# Patient Record
Sex: Male | Born: 1990 | Race: Black or African American | Hispanic: No | Marital: Single | Smoking: Never smoker
Health system: Southern US, Community
[De-identification: ages and names within clinical notes are randomized; demographics above are authoritative.]

## PROBLEM LIST (undated history)

## (undated) DIAGNOSIS — Z789 Other specified health status: Secondary | ICD-10-CM

## (undated) HISTORY — PX: NO PAST SURGERIES: SHX2092

---

## 2014-04-29 ENCOUNTER — Encounter (HOSPITAL_COMMUNITY): Payer: Self-pay | Admitting: Emergency Medicine

## 2014-04-29 ENCOUNTER — Inpatient Hospital Stay (HOSPITAL_COMMUNITY)
Admission: EM | Admit: 2014-04-29 | Discharge: 2014-05-02 | DRG: 917 | Disposition: A | Discharge status: Home / Self Care | Payer: Self-pay | Attending: Internal Medicine | Admitting: Internal Medicine

## 2014-04-29 DIAGNOSIS — G9340 Encephalopathy, unspecified: Secondary | ICD-10-CM | POA: Diagnosis present

## 2014-04-29 DIAGNOSIS — R509 Fever, unspecified: Secondary | ICD-10-CM

## 2014-04-29 DIAGNOSIS — E876 Hypokalemia: Secondary | ICD-10-CM | POA: Diagnosis present

## 2014-04-29 DIAGNOSIS — I10 Essential (primary) hypertension: Secondary | ICD-10-CM | POA: Diagnosis present

## 2014-04-29 DIAGNOSIS — F329 Major depressive disorder, single episode, unspecified: Secondary | ICD-10-CM | POA: Diagnosis present

## 2014-04-29 DIAGNOSIS — T50902A Poisoning by unspecified drugs, medicaments and biological substances, intentional self-harm, initial encounter: Secondary | ICD-10-CM

## 2014-04-29 DIAGNOSIS — E860 Dehydration: Secondary | ICD-10-CM | POA: Diagnosis present

## 2014-04-29 DIAGNOSIS — T450X2A Poisoning by antiallergic and antiemetic drugs, intentional self-harm, initial encounter: Principal | ICD-10-CM | POA: Diagnosis present

## 2014-04-29 DIAGNOSIS — N179 Acute kidney failure, unspecified: Secondary | ICD-10-CM | POA: Diagnosis present

## 2014-04-29 DIAGNOSIS — Z23 Encounter for immunization: Secondary | ICD-10-CM

## 2014-04-29 DIAGNOSIS — T50901A Poisoning by unspecified drugs, medicaments and biological substances, accidental (unintentional), initial encounter: Secondary | ICD-10-CM | POA: Diagnosis present

## 2014-04-29 DIAGNOSIS — R45851 Suicidal ideations: Secondary | ICD-10-CM | POA: Diagnosis present

## 2014-04-29 DIAGNOSIS — E870 Hyperosmolality and hypernatremia: Secondary | ICD-10-CM | POA: Diagnosis present

## 2014-04-29 HISTORY — DX: Other specified health status: Z78.9

## 2014-04-29 LAB — COMPREHENSIVE METABOLIC PANEL
ALT: 32 U/L (ref 0–53)
ANION GAP: 19 — AB (ref 5–15)
AST: 35 U/L (ref 0–37)
Albumin: 4.7 g/dL (ref 3.5–5.2)
Alkaline Phosphatase: 84 U/L (ref 39–117)
BUN: 10 mg/dL (ref 6–23)
CALCIUM: 9.9 mg/dL (ref 8.4–10.5)
CO2: 23 mEq/L (ref 19–32)
Chloride: 107 mEq/L (ref 96–112)
Creatinine, Ser: 1.24 mg/dL (ref 0.50–1.35)
GFR calc non Af Amer: 81 mL/min — ABNORMAL LOW (ref >90)
GLUCOSE: 137 mg/dL — AB (ref 70–99)
Potassium: 3.4 mEq/L — ABNORMAL LOW (ref 3.7–5.3)
SODIUM: 149 meq/L — AB (ref 137–147)
Total Bilirubin: <0.2 mg/dL — ABNORMAL LOW (ref 0.3–1.2)
Total Protein: 8.2 g/dL (ref 6.0–8.3)

## 2014-04-29 LAB — BASIC METABOLIC PANEL
Anion gap: 14 (ref 5–15)
BUN: 9 mg/dL (ref 6–23)
CHLORIDE: 106 meq/L (ref 96–112)
CO2: 23 mEq/L (ref 19–32)
Calcium: 9.6 mg/dL (ref 8.4–10.5)
Creatinine, Ser: 1.25 mg/dL (ref 0.50–1.35)
GFR calc Af Amer: >90 mL/min (ref >90)
GFR calc non Af Amer: 80 mL/min — ABNORMAL LOW (ref >90)
GLUCOSE: 91 mg/dL (ref 70–99)
Potassium: 3.8 mEq/L (ref 3.7–5.3)
Sodium: 143 mEq/L (ref 137–147)

## 2014-04-29 LAB — CBC
HCT: 42.6 % (ref 39.0–52.0)
HCT: 39.5 % (ref 39.0–52.0)
HEMOGLOBIN: 14.6 g/dL (ref 13.0–17.0)
HEMOGLOBIN: 13.2 g/dL (ref 13.0–17.0)
MCH: 26.3 pg (ref 26.0–34.0)
MCH: 25.6 pg — AB (ref 26.0–34.0)
MCHC: 34.3 g/dL (ref 30.0–36.0)
MCHC: 33.4 g/dL (ref 30.0–36.0)
MCV: 76.8 fL — ABNORMAL LOW (ref 78.0–100.0)
MCV: 76.7 fL — ABNORMAL LOW (ref 78.0–100.0)
Platelets: 257 10*3/uL (ref 150–400)
Platelets: 266 10*3/uL (ref 150–400)
RBC: 5.55 MIL/uL (ref 4.22–5.81)
RBC: 5.15 MIL/uL (ref 4.22–5.81)
RDW: 12.7 % (ref 11.5–15.5)
RDW: 12.8 % (ref 11.5–15.5)
WBC: 10.2 10*3/uL (ref 4.0–10.5)
WBC: 6.1 10*3/uL (ref 4.0–10.5)

## 2014-04-29 LAB — RAPID URINE DRUG SCREEN, HOSP PERFORMED
Amphetamines: NOT DETECTED
BARBITURATES: NOT DETECTED
Benzodiazepines: NOT DETECTED
Cocaine: NOT DETECTED
Opiates: NOT DETECTED
TETRAHYDROCANNABINOL: NOT DETECTED

## 2014-04-29 LAB — CBG MONITORING, ED: Glucose-Capillary: 81 mg/dL (ref 70–99)

## 2014-04-29 LAB — ACETAMINOPHEN LEVEL: Acetaminophen (Tylenol), Serum: <15 ug/mL (ref 10–30)

## 2014-04-29 LAB — MAGNESIUM: Magnesium: 1.9 mg/dL (ref 1.5–2.5)

## 2014-04-29 LAB — ETHANOL: Alcohol, Ethyl (B): <11 mg/dL (ref 0–11)

## 2014-04-29 LAB — SALICYLATE LEVEL: Salicylate Lvl: <2 mg/dL — ABNORMAL LOW (ref 2.8–20.0)

## 2014-04-29 LAB — MRSA PCR SCREENING: MRSA by PCR: NEGATIVE

## 2014-04-29 MED ORDER — LORAZEPAM 2 MG/ML IJ SOLN: 1.0000 mg | INTRAMUSCULAR | Status: DC | PRN

## 2014-04-29 MED ORDER — POTASSIUM CHLORIDE 2 MEQ/ML IV SOLN
INTRAVENOUS | Status: AC
  Administered 2014-04-29 – 2014-04-30 (×2): via INTRAVENOUS
  Filled 2014-04-29 (×4): qty 1000

## 2014-04-29 MED ORDER — SODIUM CHLORIDE 0.9 % IV BOLUS (SEPSIS)
1000.0000 mL | Freq: Once | INTRAVENOUS | Status: AC
  Administered 2014-04-29: 1000 mL via INTRAVENOUS

## 2014-04-29 MED ORDER — THIAMINE HCL 100 MG/ML IJ SOLN
100.0000 mg | Freq: Every day | INTRAMUSCULAR | Status: DC
  Administered 2014-04-29 – 2014-05-01 (×3): 100 mg via INTRAVENOUS
  Filled 2014-04-29: qty 2
  Filled 2014-04-29: qty 1
  Filled 2014-04-29: qty 2
  Filled 2014-04-29: qty 1

## 2014-04-29 MED ORDER — ONDANSETRON HCL 4 MG PO TABS: 4.0000 mg | ORAL_TABLET | Freq: Four times a day (QID) | ORAL | Status: DC | PRN

## 2014-04-29 MED ORDER — ENOXAPARIN SODIUM 40 MG/0.4ML ~~LOC~~ SOLN
40.0000 mg | SUBCUTANEOUS | Status: DC
  Administered 2014-04-29 – 2014-04-30 (×2): 40 mg via SUBCUTANEOUS
  Filled 2014-04-29 (×3): qty 0.4

## 2014-04-29 MED ORDER — ACETAMINOPHEN 650 MG RE SUPP: 650.0000 mg | Freq: Four times a day (QID) | RECTAL | Status: DC | PRN

## 2014-04-29 MED ORDER — SODIUM CHLORIDE 0.9 % IV BOLUS (SEPSIS): 1000.0000 mL | Freq: Once | INTRAVENOUS | Status: DC

## 2014-04-29 MED ORDER — ACETAMINOPHEN 325 MG PO TABS
650.0000 mg | ORAL_TABLET | Freq: Four times a day (QID) | ORAL | Status: DC | PRN
Start: 2014-04-29 — End: 2014-05-02

## 2014-04-29 MED ORDER — ONDANSETRON HCL 4 MG/2ML IJ SOLN: 4.0000 mg | Freq: Four times a day (QID) | INTRAMUSCULAR | Status: DC | PRN

## 2014-04-29 MED ORDER — SODIUM CHLORIDE 0.9 % IJ SOLN
3.0000 mL | Freq: Two times a day (BID) | INTRAMUSCULAR | Status: DC
  Administered 2014-04-29: 3 mL via INTRAVENOUS
  Administered 2014-04-30: 11:00:00 via INTRAVENOUS
  Administered 2014-05-01: 3 mL via INTRAVENOUS

## 2014-04-29 MED ORDER — VITAMINS A & D EX OINT
TOPICAL_OINTMENT | CUTANEOUS | Status: AC
  Filled 2014-04-29: qty 5

## 2014-04-29 MED ORDER — LORAZEPAM 2 MG/ML IJ SOLN
1.0000 mg | Freq: Once | INTRAMUSCULAR | Status: AC
  Administered 2014-04-29: 1 mg via INTRAVENOUS
  Filled 2014-04-29: qty 1
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
## 2014-04-29 NOTE — ED Notes (Signed)
Bed: RESA Expected date:  Expected time:  Means of arrival: Ambulance (overdose) Comments: overdose

## 2014-04-29 NOTE — H&P (Signed)
[facility] History and Physical  Alejandro Braun WUJ:[id number] DOB: 04/30/1991 DOA: 04/29/2014  Referring physician: ER physician. PCP: No primary care provider on file.   History obtained from ER physician and patient's friend as patient is confused.  Chief Complaint: Drug overdose.  HPI: Alejandro Braun is a 23 y.o. male with no significant past medical history was brought to the ER after patient's friend found that patient was confused and told that he has taken overdose of Benadryl with intention of killing himself. Patient's friend stated that patient initially took Zquil and later Benadryl itself. The quantity was not known but he had consumed almost whole bottle of Benadryl tablets. Other medications were not taken along with it as per the information. In the ER patient was found to be tachycardic and has gradually become more confused. Poison control at this time as requested supportive care with IV fluids and if patient becomes very tachycardic or hypertensive or agitated then to give Ativan as needed and also to watch out for QRS widening. Patient on my exam is confused but follows commands and is oriented to his name only.   Review of Systems: As presented in the history of presenting illness, rest negative.  Past Medical History  Diagnosis Date  . Medical history non-contributory    Past Surgical History  Procedure Laterality Date  . No past surgeries     Social History:  reports that he has never smoked. He does not have any smokeless tobacco history on file. He reports that he drinks alcohol. He reports that he does not use illicit drugs. Where does patient live home. Can patient participate in ADLs? Yes.  No Known Allergies  Family History: History reviewed. No pertinent family history.    Prior to Admission medications   Not on File    Physical Exam: Filed Vitals:   04/29/14 1800 04/29/14 1804 04/29/14 1830 04/29/14 1928  BP: 134/94  124/62 140/92 142/100  Pulse: 116 113 118 116  Temp:      TempSrc:      Resp: 29 22 22 24   SpO2: 98%  98% 99%     General:  Well-developed and nourished.  Eyes: Anicteric no pallor.  ENT: No discharge from the ears eyes nose mouth.  Neck: No mass. No neck rigidity.  Cardiovascular: S1-S2 heard tachycardic.  Respiratory: No rhonchi or crepitations.  Abdomen: Soft nontender bowel sounds present.  Skin: No rash.  Musculoskeletal: No edema.  Psychiatric: Patient is confused.  Neurologic: Patient is confused and is oriented to his name only. Moves all extremities.  Labs on Admission:  Basic Metabolic Panel:  Recent Labs Lab 04/29/14 1607  NA 149*  K 3.4*  CL 107  CO2 23  GLUCOSE 137*  BUN 10  CREATININE 1.24  CALCIUM 9.9   Liver Function Tests:  Recent Labs Lab 04/29/14 1607  AST 35  ALT 32  ALKPHOS 84  BILITOT <0.2*  PROT 8.2  ALBUMIN 4.7   No results for input(s): LIPASE, AMYLASE in the last 168 hours. No results for input(s): AMMONIA in the last 168 hours. CBC:  Recent Labs Lab 04/29/14 1607  WBC 10.2  HGB 14.6  HCT 42.6  MCV 76.8*  PLT 257   Cardiac Enzymes: No results for input(s): CKTOTAL, CKMB, CKMBINDEX, TROPONINI in the last 168 hours.  BNP (last 3 results) No results for input(s): PROBNP in the last 8760 hours. CBG:  Recent Labs Lab 04/29/14 1610  GLUCAP 81    Radiological Exams  on Admission: No results found.  EKG: Independently reviewed. Sinus tachycardia. QRS 82 ms.  Assessment/Plan Active Problems:   Drug overdose   Suicide ideation   Acute encephalopathy   Hypernatremia   1. Acute encephalopathy secondary to intentional drug overdose with Benadryl - patient at this time is being already placed on 2 L normal saline bolus and I have placed patient on D5 W since patient also has hypernatremia. Closely observe and stepdown. If patient gets very agitated or if patient becomes very hypotensive or tachycardic then  patient will need when necessary IV Ativan. If there is QRS widening more than 140 ms then patient will need bolus doses of bicarbonate (not infusion). 2. Hypernatremia - probably from dehydration. Closely follow metabolic panel. 3. Mild hypokalemia - I have placed patient on potassium in the IV fluids. Closely follow metabolic panel. 4. Suicidal ideation - patient is placed on sitter. Consult psychiatry in a.m.    Code Status: Full code.  Family Communication: Patient's friend.  Disposition Plan: Admit to inpatient.    Wanita Derenzo N. [facility] Pager [phone].  If 7PM-7AM, please contact night-coverage [email or web address] Password [email or web address] 04/29/2014, 8:35 PM
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
## 2014-04-29 NOTE — ED Notes (Signed)
Still attempting to get a urine sample, but patient is not fully coherent. Placed posey stretcher alarm under patient to alarm if he gets up.

## 2014-04-29 NOTE — ED Notes (Signed)
[facility] Suggested EKG, Monitor, observe for prolonged QRS and treat with Na Bicarb until narrowed, Benzo's for tachy HR, Observe for seizures, Benzo's to treat. Reg labs plus post 4 hr Tylenol level.

## 2014-04-29 NOTE — ED Notes (Signed)
Pt served IVC papers via [employer].

## 2014-04-29 NOTE — ED Notes (Signed)
Stretcher alarm does not work in recess room

## 2014-04-30 ENCOUNTER — Inpatient Hospital Stay (HOSPITAL_COMMUNITY): Payer: Self-pay

## 2014-04-30 ENCOUNTER — Encounter (HOSPITAL_COMMUNITY): Payer: Self-pay | Admitting: *Deleted

## 2014-04-30 DIAGNOSIS — E876 Hypokalemia: Secondary | ICD-10-CM

## 2014-04-30 DIAGNOSIS — F329 Major depressive disorder, single episode, unspecified: Secondary | ICD-10-CM

## 2014-04-30 DIAGNOSIS — T450X2A Poisoning by antiallergic and antiemetic drugs, intentional self-harm, initial encounter: Principal | ICD-10-CM

## 2014-04-30 LAB — GLUCOSE, CAPILLARY: GLUCOSE-CAPILLARY: 96 mg/dL (ref 70–99)

## 2014-04-30 LAB — BASIC METABOLIC PANEL
Anion gap: 14 (ref 5–15)
Anion gap: 14 (ref 5–15)
BUN: 8 mg/dL (ref 6–23)
BUN: 8 mg/dL (ref 6–23)
CHLORIDE: 105 meq/L (ref 96–112)
CHLORIDE: 102 meq/L (ref 96–112)
CO2: 24 mEq/L (ref 19–32)
CO2: 25 meq/L (ref 19–32)
Calcium: 9.6 mg/dL (ref 8.4–10.5)
Calcium: 9.8 mg/dL (ref 8.4–10.5)
Creatinine, Ser: 1.29 mg/dL (ref 0.50–1.35)
Creatinine, Ser: 1.39 mg/dL — ABNORMAL HIGH (ref 0.50–1.35)
GFR calc Af Amer: 89 mL/min — ABNORMAL LOW (ref >90)
GFR calc non Af Amer: 77 mL/min — ABNORMAL LOW (ref >90)
GFR calc non Af Amer: 70 mL/min — ABNORMAL LOW (ref >90)
GFR, EST AFRICAN AMERICAN: 82 mL/min — AB (ref >90)
GLUCOSE: 102 mg/dL — AB (ref 70–99)
GLUCOSE: 101 mg/dL — AB (ref 70–99)
POTASSIUM: 3.5 meq/L — AB (ref 3.7–5.3)
Potassium: 3.7 mEq/L (ref 3.7–5.3)
Sodium: 143 mEq/L (ref 137–147)
Sodium: 141 mEq/L (ref 137–147)

## 2014-04-30 LAB — CBC WITH DIFFERENTIAL/PLATELET
BASOS ABS: 0 10*3/uL (ref 0.0–0.1)
Basophils Relative: 0 % (ref 0–1)
EOS PCT: 2 % (ref 0–5)
Eosinophils Absolute: 0.1 10*3/uL (ref 0.0–0.7)
HCT: 42.4 % (ref 39.0–52.0)
Hemoglobin: 13.9 g/dL (ref 13.0–17.0)
LYMPHS ABS: 2.5 10*3/uL (ref 0.7–4.0)
Lymphocytes Relative: 36 % (ref 12–46)
MCH: 25.1 pg — ABNORMAL LOW (ref 26.0–34.0)
MCHC: 32.8 g/dL (ref 30.0–36.0)
MCV: 76.7 fL — ABNORMAL LOW (ref 78.0–100.0)
MONO ABS: 0.4 10*3/uL (ref 0.1–1.0)
Monocytes Relative: 6 % (ref 3–12)
Neutro Abs: 3.9 10*3/uL (ref 1.7–7.7)
Neutrophils Relative %: 56 % (ref 43–77)
Platelets: 275 10*3/uL (ref 150–400)
RBC: 5.53 MIL/uL (ref 4.22–5.81)
RDW: 13.1 % (ref 11.5–15.5)
WBC: 7 10*3/uL (ref 4.0–10.5)

## 2014-04-30 LAB — HEPATIC FUNCTION PANEL
ALT: 27 U/L (ref 0–53)
AST: 30 U/L (ref 0–37)
Albumin: 4.4 g/dL (ref 3.5–5.2)
Alkaline Phosphatase: 80 U/L (ref 39–117)
Bilirubin, Direct: <0.2 mg/dL (ref 0.0–0.3)
TOTAL PROTEIN: 7.7 g/dL (ref 6.0–8.3)
Total Bilirubin: 0.3 mg/dL (ref 0.3–1.2)

## 2014-04-30 LAB — URINALYSIS, ROUTINE W REFLEX MICROSCOPIC
Bilirubin Urine: NEGATIVE
Glucose, UA: NEGATIVE mg/dL
HGB URINE DIPSTICK: NEGATIVE
Ketones, ur: NEGATIVE mg/dL
Leukocytes, UA: NEGATIVE
Nitrite: NEGATIVE
PH: 6.5 (ref 5.0–8.0)
Protein, ur: NEGATIVE mg/dL
SPECIFIC GRAVITY, URINE: 1.006 (ref 1.005–1.030)
Urobilinogen, UA: 0.2 mg/dL (ref 0.0–1.0)

## 2014-04-30 LAB — TSH: TSH: 2.51 u[IU]/mL (ref 0.350–4.500)

## 2014-04-30 MED ORDER — INFLUENZA VAC SPLIT QUAD 0.5 ML IM SUSY
0.5000 mL | PREFILLED_SYRINGE | INTRAMUSCULAR | Status: AC
  Administered 2014-05-01: 0.5 mL via INTRAMUSCULAR
  Filled 2014-04-30 (×2): qty 0.5

## 2014-04-30 NOTE — Consult Note (Signed)
[place] [facility] Consult   Reason for Consult:  overdose of Benadryl with intention of killing himself Referring Physician:  Rise Patience, MD Alejandro Braun is an 23 y.o. male. Total Time spent with patient: 45 minutes  Assessment: AXIS I:  Depressive Disorder NOS AXIS II:  Deferred AXIS III:   Past Medical History  Diagnosis Date  . Medical history non-contributory    AXIS IV:  other psychosocial or environmental problems, problems related to social environment and problems with access to health care services AXIS V:  41-50 serious symptoms  Plan: Case will be discussed with Rise Patience, MD and Sindy Messing. LCSW Recommend psychiatric Inpatient admission when medically cleared. Supportive therapy provided about ongoing stressors.  Appreciate psychiatric consultation and follow up as clinically required Please contact [phone] or [phone] if needs further assistance  Subjective:   Alejandro Braun is a 23 y.o. male patient admitted with overdose of Benadryl with intention of killing himself.  HPI:  Alejandro Braun is a 23 y.o. male seen and chart reviewed for psych consulation and evaluation of depression and status post suicidal attempt with overdose on benadryl and Zquil. Patient stated that he wants to end his life because of severe psychosocial and financial stresses. He has an argument with his GF x 2 1/2 years regarding moving out of his mother's home and saving money instead of spending on his mother utility bills etc. His GF saw him briefly after overdose and called EMS. Patient has no past history of mental illness and medical conditions. He works in Doctor, general practice. He does not know his biological father and close to his mother and grand mother. His mother is out of town for work.   Medical History: Patient with no significant past medical history was brought to the ER after patient's friend found that patient  was confused and told that he has taken overdose of Benadryl with intention of killing himself. Patient's friend stated that patient initially took Glass[place] and later Benadryl itself. The quantity was not known but he had consumed almost whole bottle of Benadryl tablets. Other medications were not taken along with it as per the information. In the ER patient was found to be tachycardic and has gradually become more confused. Poison control at this time as requested supportive care with IV fluids and if patient becomes very tachycardic or hypertensive or agitated then to give Ativan as needed and also to watch out for QRS widening. Patient on my exam is confused but follows commands and is oriented to his name only.   Review of Systems: As presented in the history of presenting illness, rest negative.  HPI Elements:   Location:  depression. Quality:  psychosocial stresses. Severity:  intentional overdose. Timing:  financial difficulties and argument with GF. Duration:  two months. Context:  status post suicide attempt.  Past Psychiatric History: Past Medical History  Diagnosis Date  . Medical history non-contributory     reports that he has never smoked. He does not have any smokeless tobacco history on file. He reports that he drinks about 1.2 oz of alcohol per week. He reports that he does not use illicit drugs. Family History  Problem Relation Age of Onset  . Diabetes type I Mother      Living Arrangements: Spouse/significant other   Abuse/Neglect [facility]) Physical Abuse: Denies Verbal Abuse: Denies Sexual Abuse: Denies Allergies:  No Known Allergies  ACT Assessment Complete:  NO Objective: Blood pressure 157/100,  pulse 100, temperature 98.7 F (37.1 C), temperature source Oral, resp. rate 16, height 5\' 10"  (1.778 m), weight 91.8 kg (202 lb 6.1 oz), SpO2 100 %.Body mass index is 29.04 kg/(m^2). Results for orders placed or performed during the hospital encounter of 04/29/14 (from the  past 72 hour(s))  CBC     Status: Abnormal   Collection Time: 04/29/14  4:07 PM  Result Value Ref Range   WBC 10.2 4.0 - 10.5 K/uL   RBC 5.55 4.22 - 5.81 MIL/uL   Hemoglobin 14.6 13.0 - 17.0 g/dL   HCT 05/01/14 79.1 - 05.8 %   MCV 76.8 (L) 78.0 - 100.0 fL   MCH 26.3 26.0 - 34.0 pg   MCHC 34.3 30.0 - 36.0 g/dL   RDW 61.0 04.2 - 90.6 %   Platelets 257 150 - 400 K/uL  Comprehensive metabolic panel     Status: Abnormal   Collection Time: 04/29/14  4:07 PM  Result Value Ref Range   Sodium 149 (H) 137 - 147 mEq/L   Potassium 3.4 (L) 3.7 - 5.3 mEq/L   Chloride 107 96 - 112 mEq/L   CO2 23 19 - 32 mEq/L   Glucose, Bld 137 (H) 70 - 99 mg/dL   BUN 10 6 - 23 mg/dL   Creatinine, Ser 05/01/14 0.50 - 1.35 mg/dL   Calcium 9.9 8.4 - 3.92 mg/dL   Total Protein 8.2 6.0 - 8.3 g/dL   Albumin 4.7 3.5 - 5.2 g/dL   AST 35 0 - 37 U/L   ALT 32 0 - 53 U/L   Alkaline Phosphatase 84 39 - 117 U/L   Total Bilirubin <0.2 (L) 0.3 - 1.2 mg/dL   GFR calc non Af Amer 81 (L) >90 mL/min   GFR calc Af Amer >90 >90 mL/min    Comment: (NOTE) The eGFR has been calculated using the CKD EPI equation. This calculation has not been validated in all clinical situations. eGFR's persistently <90 mL/min signify possible Chronic Kidney Disease.    Anion gap 19 (H) 5 - 15  Ethanol (ETOH)     Status: None   Collection Time: 04/29/14  4:07 PM  Result Value Ref Range   Alcohol, Ethyl (B) <11 0 - 11 mg/dL    Comment:        LOWEST DETECTABLE LIMIT FOR SERUM ALCOHOL IS 11 mg/dL FOR MEDICAL PURPOSES ONLY   Acetaminophen level     Status: None   Collection Time: 04/29/14  4:07 PM  Result Value Ref Range   Acetaminophen (Tylenol), Serum <15.0 10 - 30 ug/mL    Comment:        THERAPEUTIC CONCENTRATIONS VARY SIGNIFICANTLY. A RANGE OF 10-30 ug/mL MAY BE AN EFFECTIVE CONCENTRATION FOR MANY PATIENTS. HOWEVER, SOME ARE BEST TREATED AT CONCENTRATIONS OUTSIDE THIS RANGE. ACETAMINOPHEN CONCENTRATIONS >150 ug/mL AT 4 HOURS  AFTER INGESTION AND >50 ug/mL AT 12 HOURS AFTER INGESTION ARE OFTEN ASSOCIATED WITH TOXIC REACTIONS.   Salicylate level     Status: Abnormal   Collection Time: 04/29/14  4:07 PM  Result Value Ref Range   Salicylate Lvl <2.0 (L) 2.8 - 20.0 mg/dL  CBG monitoring, ED     Status: None   Collection Time: 04/29/14  4:10 PM  Result Value Ref Range   Glucose-Capillary 81 70 - 99 mg/dL   Comment 1 Notify RN   Urine rapid drug screen (hosp performed)     Status: None   Collection Time: 04/29/14  6:33 PM  Result Value Ref  Range   Opiates NONE DETECTED NONE DETECTED   Cocaine NONE DETECTED NONE DETECTED   Benzodiazepines NONE DETECTED NONE DETECTED   Amphetamines NONE DETECTED NONE DETECTED   Tetrahydrocannabinol NONE DETECTED NONE DETECTED   Barbiturates NONE DETECTED NONE DETECTED    Comment:        DRUG SCREEN FOR MEDICAL PURPOSES ONLY.  IF CONFIRMATION IS NEEDED FOR ANY PURPOSE, NOTIFY LAB WITHIN 5 DAYS.        LOWEST DETECTABLE LIMITS FOR URINE DRUG SCREEN Drug Class       Cutoff (ng/mL) Amphetamine      1000 Barbiturate      200 Benzodiazepine   829 Tricyclics       937 Opiates          300 Cocaine          300 THC              50   MRSA PCR Screening     Status: None   Collection Time: 04/29/14  9:05 PM  Result Value Ref Range   MRSA by PCR NEGATIVE NEGATIVE    Comment:        The GeneXpert MRSA Assay (FDA approved for NASAL specimens only), is one component of a comprehensive MRSA colonization surveillance program. It is not intended to diagnose MRSA infection nor to guide or monitor treatment for MRSA infections.   Basic metabolic panel     Status: Abnormal   Collection Time: 04/29/14  9:27 PM  Result Value Ref Range   Sodium 143 137 - 147 mEq/L   Potassium 3.8 3.7 - 5.3 mEq/L   Chloride 106 96 - 112 mEq/L   CO2 23 19 - 32 mEq/L   Glucose, Bld 91 70 - 99 mg/dL   BUN 9 6 - 23 mg/dL   Creatinine, Ser 1.25 0.50 - 1.35 mg/dL   Calcium 9.6 8.4 - 10.5 mg/dL    GFR calc non Af Amer 80 (L) >90 mL/min   GFR calc Af Amer >90 >90 mL/min    Comment: (NOTE) The eGFR has been calculated using the CKD EPI equation. This calculation has not been validated in all clinical situations. eGFR's persistently <90 mL/min signify possible Chronic Kidney Disease.    Anion gap 14 5 - 15  Magnesium     Status: None   Collection Time: 04/29/14  9:27 PM  Result Value Ref Range   Magnesium 1.9 1.5 - 2.5 mg/dL  Acetaminophen level     Status: None   Collection Time: 04/29/14  9:27 PM  Result Value Ref Range   Acetaminophen (Tylenol), Serum <15.0 10 - 30 ug/mL    Comment:        THERAPEUTIC CONCENTRATIONS VARY SIGNIFICANTLY. A RANGE OF 10-30 ug/mL MAY BE AN EFFECTIVE CONCENTRATION FOR MANY PATIENTS. HOWEVER, SOME ARE BEST TREATED AT CONCENTRATIONS OUTSIDE THIS RANGE. ACETAMINOPHEN CONCENTRATIONS >150 ug/mL AT 4 HOURS AFTER INGESTION AND >50 ug/mL AT 12 HOURS AFTER INGESTION ARE OFTEN ASSOCIATED WITH TOXIC REACTIONS.   Salicylate level     Status: Abnormal   Collection Time: 04/29/14  9:27 PM  Result Value Ref Range   Salicylate Lvl <1.6 (L) 2.8 - 20.0 mg/dL  CBC     Status: Abnormal   Collection Time: 04/29/14  9:27 PM  Result Value Ref Range   WBC 6.1 4.0 - 10.5 K/uL   RBC 5.15 4.22 - 5.81 MIL/uL   Hemoglobin 13.2 13.0 - 17.0 g/dL   HCT 39.5 39.0 - 52.0 %  MCV 76.7 (L) 78.0 - 100.0 fL   MCH 25.6 (L) 26.0 - 34.0 pg   MCHC 33.4 30.0 - 36.0 g/dL   RDW 49.8 69.2 - 29.4 %   Platelets 266 150 - 400 K/uL  TSH     Status: None   Collection Time: 04/29/14  9:27 PM  Result Value Ref Range   TSH 2.510 0.350 - 4.500 uIU/mL    Comment: Performed at [facility]  Glucose, capillary     Status: None   Collection Time: 04/29/14  9:56 PM  Result Value Ref Range   Glucose-Capillary 96 70 - 99 mg/dL   Comment 1 Repeat Test   Basic metabolic panel     Status: Abnormal   Collection Time: 04/30/14  1:05 AM  Result Value Ref Range   Sodium 143 137 -  147 mEq/L   Potassium 3.7 3.7 - 5.3 mEq/L   Chloride 105 96 - 112 mEq/L   CO2 24 19 - 32 mEq/L   Glucose, Bld 102 (H) 70 - 99 mg/dL   BUN 8 6 - 23 mg/dL   Creatinine, Ser 0.82 0.50 - 1.35 mg/dL   Calcium 9.6 8.4 - 36.6 mg/dL   GFR calc non Af Amer 77 (L) >90 mL/min   GFR calc Af Amer 89 (L) >90 mL/min    Comment: (NOTE) The eGFR has been calculated using the CKD EPI equation. This calculation has not been validated in all clinical situations. eGFR's persistently <90 mL/min signify possible Chronic Kidney Disease.    Anion gap 14 5 - 15  Urinalysis, Routine w reflex microscopic     Status: None   Collection Time: 04/30/14  3:13 AM  Result Value Ref Range   Color, Urine YELLOW YELLOW   APPearance CLEAR CLEAR   Specific Gravity, Urine 1.006 1.005 - 1.030   pH 6.5 5.0 - 8.0   Glucose, UA NEGATIVE NEGATIVE mg/dL   Hgb urine dipstick NEGATIVE NEGATIVE   Bilirubin Urine NEGATIVE NEGATIVE   Ketones, ur NEGATIVE NEGATIVE mg/dL   Protein, ur NEGATIVE NEGATIVE mg/dL   Urobilinogen, UA 0.2 0.0 - 1.0 mg/dL   Nitrite NEGATIVE NEGATIVE   Leukocytes, UA NEGATIVE NEGATIVE    Comment: MICROSCOPIC NOT DONE ON URINES WITH NEGATIVE PROTEIN, BLOOD, LEUKOCYTES, NITRITE, OR GLUCOSE <1000 mg/dL.  Basic metabolic panel     Status: Abnormal   Collection Time: 04/30/14  5:10 AM  Result Value Ref Range   Sodium 141 137 - 147 mEq/L   Potassium 3.5 (L) 3.7 - 5.3 mEq/L   Chloride 102 96 - 112 mEq/L   CO2 25 19 - 32 mEq/L   Glucose, Bld 101 (H) 70 - 99 mg/dL   BUN 8 6 - 23 mg/dL   Creatinine, Ser 9.80 (H) 0.50 - 1.35 mg/dL   Calcium 9.8 8.4 - 28.7 mg/dL   GFR calc non Af Amer 70 (L) >90 mL/min   GFR calc Af Amer 82 (L) >90 mL/min    Comment: (NOTE) The eGFR has been calculated using the CKD EPI equation. This calculation has not been validated in all clinical situations. eGFR's persistently <90 mL/min signify possible Chronic Kidney Disease.    Anion gap 14 5 - 15  Hepatic function panel      Status: None   Collection Time: 04/30/14  5:10 AM  Result Value Ref Range   Total Protein 7.7 6.0 - 8.3 g/dL   Albumin 4.4 3.5 - 5.2 g/dL   AST 30 0 - 37 U/L  ALT 27 0 - 53 U/L   Alkaline Phosphatase 80 39 - 117 U/L   Total Bilirubin 0.3 0.3 - 1.2 mg/dL   Bilirubin, Direct <0.2 0.0 - 0.3 mg/dL   Indirect Bilirubin NOT CALCULATED 0.3 - 0.9 mg/dL  CBC with Differential     Status: Abnormal   Collection Time: 04/30/14  5:10 AM  Result Value Ref Range   WBC 7.0 4.0 - 10.5 K/uL   RBC 5.53 4.22 - 5.81 MIL/uL   Hemoglobin 13.9 13.0 - 17.0 g/dL   HCT 42.4 39.0 - 52.0 %   MCV 76.7 (L) 78.0 - 100.0 fL   MCH 25.1 (L) 26.0 - 34.0 pg   MCHC 32.8 30.0 - 36.0 g/dL   RDW 13.1 11.5 - 15.5 %   Platelets 275 150 - 400 K/uL   Neutrophils Relative % 56 43 - 77 %   Neutro Abs 3.9 1.7 - 7.7 K/uL   Lymphocytes Relative 36 12 - 46 %   Lymphs Abs 2.5 0.7 - 4.0 K/uL   Monocytes Relative 6 3 - 12 %   Monocytes Absolute 0.4 0.1 - 1.0 K/uL   Eosinophils Relative 2 0 - 5 %   Eosinophils Absolute 0.1 0.0 - 0.7 K/uL   Basophils Relative 0 0 - 1 %   Basophils Absolute 0.0 0.0 - 0.1 K/uL   Labs are reviewed.  Current Facility-Administered Medications  Medication Dose Route Frequency Provider Last Rate Last Dose  . acetaminophen (TYLENOL) tablet 650 mg  650 mg Oral Q6H PRN Rise Patience, MD       Or  . acetaminophen (TYLENOL) suppository 650 mg  650 mg Rectal Q6H PRN Rise Patience, MD      . dextrose 5 % 1,000 mL with potassium chloride 20 mEq infusion   Intravenous Continuous Rise Patience, MD 125 mL/hr at 04/29/14 2143    . enoxaparin (LOVENOX) injection 40 mg  40 mg Subcutaneous Q24H Rise Patience, MD   40 mg at 04/29/14 2151  . [START ON 05/01/2014] Influenza vac split quadrivalent PF (FLUARIX) injection 0.5 mL  0.5 mL Intramuscular Tomorrow-1000 Rise Patience, MD      . LORazepam (ATIVAN) injection 1 mg  1 mg Intravenous Q2H PRN Rise Patience, MD      . ondansetron  [facility]) tablet 4 mg  4 mg Oral Q6H PRN Rise Patience, MD       Or  . ondansetron [facility]) injection 4 mg  4 mg Intravenous Q6H PRN Rise Patience, MD      . sodium chloride 0.9 % bolus 1,000 mL  1,000 mL Intravenous Once Mariea Clonts, MD   Stopped at 04/29/14 1908  . sodium chloride 0.9 % injection 3 mL  3 mL Intravenous Q12H Rise Patience, MD   3 mL at 04/29/14 2200  . thiamine (B-1) injection 100 mg  100 mg Intravenous Daily Rise Patience, MD   100 mg at 04/29/14 2150  . vitamin A & D ointment             Psychiatric Specialty Exam: Physical Exam as per history and physical  ROS depressions, stresses and impulsive overdose  Blood pressure 157/100, pulse 100, temperature 98.7 F (37.1 C), temperature source Oral, resp. rate 16, height $RemoveBe'5\' 10"'[id number]$  (1.778 m), weight 91.8 kg (202 lb 6.1 oz), SpO2 100 %.Body mass index is 29.04 kg/(m^2).  General Appearance: Disheveled and Guarded  Eye Contact::  Fair  Speech:  Clear  and Coherent and Slow  Volume:  Decreased  Mood:  Depressed, Hopeless and Worthless  Affect:  Constricted and Depressed  Thought Process:  Coherent and Goal Directed  Orientation:  Full (Time, Place, and Person)  Thought Content:  Rumination  Suicidal Thoughts:  Yes.  with intent/plan  Homicidal Thoughts:  No  Memory:  Immediate;   Fair Recent;   Fair  Judgement:  Impaired  Insight:  Lacking  Psychomotor Activity:  Decreased  Concentration:  Fair  Recall:  [employer] of Knowledge:Fair  Language: Good  Akathisia:  NA  Handed:  Right  AIMS (if indicated):     Assets:  Communication Skills Desire for Improvement Intimacy Leisure Time Physical Health Resilience Social Support Talents/Skills  Sleep:      Musculoskeletal: Strength & Muscle Tone: decreased Gait & Station: unable to stand Patient leans: N/A  Treatment Plan Summary: Daily contact with patient to assess and evaluate symptoms and progress in treatment Medication management   Recommend no psychotropic medication at this time and meets criteria for acute psych admission and needs crisis stabilization.   Alejandro Braun,Alejandro R. 04/30/2014 9:43 AM

## 2014-04-30 NOTE — Progress Notes (Signed)
Clinical Social Work Department CLINICAL SOCIAL WORK PSYCHIATRY SERVICE LINE ASSESSMENT 04/30/2014  Patient:  Alejandro Braun [facility]  Account:  [id number]  Admit Date:  04/29/2014  Clinical Social Worker:  Sindy Messing, LCSW  Date/Time:  04/30/2014 11:30 AM Referred by:  Physician  Date referred:  04/30/2014 Reason for Referral  Psychosocial assessment   Presenting Symptoms/Problems (In the person's/family's own words):   Psych consulted due to overdose.   Abuse/Neglect/Trauma History (check all that apply)  Denies history   Abuse/Neglect/Trauma Comments:   Psychiatric History (check all that apply)  Denies history   Psychiatric medications:  None   Current Mental Health Hospitalizations/Previous Mental Health History:   Patient denies any previous MH diagnosis or concerns.   Current provider:   None   Place and Date:   N/A   Current Medications:   Scheduled Meds:      . enoxaparin (LOVENOX) injection  40 mg Subcutaneous Q24H  . [START ON 05/01/2014] Influenza vac split quadrivalent PF  0.5 mL Intramuscular Tomorrow-1000  . sodium chloride  1,000 mL Intravenous Once  . sodium chloride  3 mL Intravenous Q12H  . thiamine IV  100 mg Intravenous Daily  . vitamin A & D            Continuous Infusions:      . dextrose 5 % 1,000 mL with potassium chloride 20 mEq infusion 125 mL/hr at 04/29/14 2143          PRN Meds:.acetaminophen **OR** acetaminophen, LORazepam, ondansetron **OR** ondansetron (ZOFRAN) IV       Previous Impatient Admission/Date/Reason:   Patient denies any previous hospitalizations.   Emotional Health / Current Symptoms    Suicide/Self Harm  Suicide attempt in past (date/description)   Suicide attempt in the past:   Patient reports he got upset with girlfriend and after the argued he took Benadryl tablets and drank a bottle of Zquill. Patient reports this was an impulsive decision and has never had SI in the past and has never attempted to harm  himself before. Patient denies any current SI or HI.   Other harmful behavior:   None reported   Psychotic/Dissociative Symptoms  None reported   Other Psychotic/Dissociative Symptoms:   N/A    Attention/Behavioral Symptoms  Within Normal Limits   Other Attention / Behavioral Symptoms:   Patient engaged in assessment.    Cognitive Impairment  Within Normal Limits   Other Cognitive Impairment:   Patient alert and oriented.    Mood and Adjustment  Mood Congruent    Stress, Anxiety, Trauma, Any Recent Loss/Stressor  Relationship   Anxiety (frequency):   N/A   Phobia (specify):   N/A   Compulsive behavior (specify):   N/A   Obsessive behavior (specify):   N/A   Other:   Patient reports he and girlfriend have been arguing more often due to upcoming move.   Substance Abuse/Use  Current substance use   SBIRT completed (please refer for detailed history):  Y  Self-reported substance use:   Patient reports he drinks about 1 day out of the week. Patient reports he drinks 2-3 beers at social gatherings. Patient denies any further substance use.   Urinary Drug Screen Completed:  Y Alcohol level:   <11    Environmental/Housing/Living Arrangement  Stable housing   Who is in the home:   Mom, grandmother, and girlfriend   Emergency contact:  [place]   Patient's Strengths and Goals (patient's own words):   Patient  has supportive family and is currently employed.   Clinical Social Worker's Interpretive Summary:   CSW received referral in order to complete psychosocial assessment. CSW reviewed chart and met with patient and girlfriend at bedside. CSW introduced myself and explained role and patient agreeable to girlfriend involvement during assessment.    Patient reports he and girlfriend have been together for about 2.5 years. Patient and girlfriend met in school and are currently living together with his mother and grandmother. Patient  and girlfriend have been saving up money and plan to move into their own apartment soon. Patient and girlfriend spoke about stressors in relationship such as upcoming move and girlfriend's parents not approving of their relationship. Patient currently works at [employer] and reports his mother and grandmother are very supportive but the rest of his family lives in [place].    Patient reports that he and girlfriend have a good relationship but started arguing yesterday. Patient reports that they have been stressed by family not supporting them and recent move. Patient denies any previous MH concerns and reports he is generally a happy person. Patient reports he had not planned to harm himself but overdosed impulsively. Patient reports he is thinking more clear now and would never want to harm himself again. Patient reports that he and girlfriend have talked about relationship problems and want to work on their communication. CSW encouraged patient and girlfriend to consider couples counseling to strengthen their relationship and develop good communication skills.    CSW explained that patient is under IVC at this time and psych MD will make recommendations for DC plans. Patient understanding of plans and receptive to assessment. CSW will continue to follow.   Disposition:  Recommend Psych CSW continuing to support while in hospital   [place], [place] [phone]

## 2014-04-30 NOTE — Progress Notes (Signed)
[facility] PROGRESS NOTE  Alejandro Braun [name] DOB: 01/29/1991 DOA: 04/29/2014 PCP: No primary care provider on file.  Assessment/Plan: Intentional drug overdose with Benadryl with associated acute encephalopathy Patient alert and oriented this morning and denies any symptoms. He reports having an argument with his girlfriend and took several tablets of benadryl and had also drank 7 shorts of vodka. Patient hypertensive but clinically stable. Maximum Temp of 100.4 F overnight. No signs of infection Continue IV hydration. Monitor QTC -She denies further suicidal ideation. History of depression or previous suicidal ideations or attempts. Psychiatry consult placed. Patient counseled. Continue sitter until cleared.  Hypokalemia Continue supplement in IV fluids  Acute kidney injury Mild. Monitor with IV hydration.   DVT prophylaxis: Subcutaneous Lovenox Diet: Regular  Code Status: Full code Family Communication: Mother and girlfriend at bedside  Disposition Plan: tx to telemetry. Home once clinically stable and cleared by psychiatry   Consultants:  Psychiatry  Procedures:  None  Antibiotics:  None  HPI/Subjective: Since seen and examined this morning. Denies any headache, dizziness, blurred vision, nausea, vomiting, breath. Denies any bowel or urinary symptoms. Admission H&P reviewed.  Objective: Filed Vitals:   04/30/14 1000  BP: 138/99  Pulse: 93  Temp:   Resp: 23    Intake/Output Summary (Last 24 hours) at 04/30/14 1255 Last data filed at 04/30/14 1010  Gross per 24 hour  Intake 1954.42 ml  Output   2375 ml  Net -420.58 ml   Filed Weights   04/29/14 2300  Weight: 91.8 kg (202 lb 6.1 oz)    Exam:   General:  Young male in no acute distress  HEENT: No pallor, moist oral mucosa, normal pupils  Chest: Clear to auscultation bilaterally  CVS: Normal S1 and S2, no murmurs  Abdomen: Soft, nondistended, nontender  Images:  Warm, no edema  CNS: Alert and oriented     Data Reviewed: Basic Metabolic Panel:  Recent Labs Lab 04/29/14 1607 04/29/14 2127 04/30/14 0105 04/30/14 0510  NA 149* 143 143 141  K 3.4* 3.8 3.7 3.5*  CL 107 106 105 102  CO2 23 23 24 25   GLUCOSE 137* 91 102* 101*  BUN 10 9 8 8   CREATININE 1.24 1.25 1.29 1.39*  CALCIUM 9.9 9.6 9.6 9.8  MG  --  1.9  --   --    Liver Function Tests:  Recent Labs Lab 04/29/14 1607 04/30/14 0510  AST 35 30  ALT 32 27  ALKPHOS 84 80  BILITOT <0.2* 0.3  PROT 8.2 7.7  ALBUMIN 4.7 4.4   No results for input(s): LIPASE, AMYLASE in the last 168 hours. No results for input(s): AMMONIA in the last 168 hours. CBC:  Recent Labs Lab 04/29/14 1607 04/29/14 2127 04/30/14 0510  WBC 10.2 6.1 7.0  NEUTROABS  --   --  3.9  HGB 14.6 13.2 13.9  HCT 42.6 39.5 42.4  MCV 76.8* 76.7* 76.7*  PLT 257 266 275   Cardiac Enzymes: No results for input(s): CKTOTAL, CKMB, CKMBINDEX, TROPONINI in the last 168 hours. BNP (last 3 results) No results for input(s): PROBNP in the last 8760 hours. CBG:  Recent Labs Lab 04/29/14 1610 04/29/14 2156  GLUCAP 81 96    Recent Results (from the past 240 hour(s))  MRSA PCR Screening     Status: None   Collection Time: 04/29/14  9:05 PM  Result Value Ref Range Status   MRSA by PCR NEGATIVE NEGATIVE Final    Comment:  The GeneXpert MRSA Assay (FDA approved for NASAL specimens only), is one component of a comprehensive MRSA colonization surveillance program. It is not intended to diagnose MRSA infection nor to guide or monitor treatment for MRSA infections.      Studies: Dg Chest Port 1 View  04/30/2014   CLINICAL DATA:  Initial evaluation for acute fever.  EXAM: PORTABLE CHEST - 1 VIEW  COMPARISON:  None.  FINDINGS: The cardiac and mediastinal silhouettes are within normal limits.  The lungs are normally inflated. No airspace consolidation, pleural effusion, or pulmonary edema is identified.  There is no pneumothorax.  No acute osseous abnormality identified.  IMPRESSION: No active cardiopulmonary disease.   Electronically Signed   By: Rise MuBenjamin  McClintock M.D.   On: 04/30/2014 03:28    Scheduled Meds: . enoxaparin (LOVENOX) injection  40 mg Subcutaneous Q24H  . [START ON 05/01/2014] Influenza vac split quadrivalent PF  0.5 mL Intramuscular Tomorrow-1000  . sodium chloride  1,000 mL Intravenous Once  . sodium chloride  3 mL Intravenous Q12H  . thiamine IV  100 mg Intravenous Daily   Continuous Infusions: . dextrose 5 % 1,000 mL with potassium chloride 20 mEq infusion 125 mL/hr at 04/29/14 2143      Time spent: 25 minutes    [name], [name]  [facility] Pager [phone] If 7PM-7AM, please contact night-coverage at [email or web address], password [email or web address] 04/30/2014, 12:55 PM  LOS: 1 day

## 2014-05-01 DIAGNOSIS — I4581 Long QT syndrome: Secondary | ICD-10-CM

## 2014-05-01 DIAGNOSIS — T50902D Poisoning by unspecified drugs, medicaments and biological substances, intentional self-harm, subsequent encounter: Secondary | ICD-10-CM

## 2014-05-01 DIAGNOSIS — N179 Acute kidney failure, unspecified: Secondary | ICD-10-CM | POA: Diagnosis not present

## 2014-05-01 LAB — BASIC METABOLIC PANEL
Anion gap: 13 (ref 5–15)
BUN: 11 mg/dL (ref 6–23)
CO2: 27 meq/L (ref 19–32)
Calcium: 10 mg/dL (ref 8.4–10.5)
Chloride: 103 mEq/L (ref 96–112)
Creatinine, Ser: 1.73 mg/dL — ABNORMAL HIGH (ref 0.50–1.35)
GFR calc Af Amer: 63 mL/min — ABNORMAL LOW (ref >90)
GFR calc non Af Amer: 54 mL/min — ABNORMAL LOW (ref >90)
GLUCOSE: 102 mg/dL — AB (ref 70–99)
Potassium: 4.8 mEq/L (ref 3.7–5.3)
SODIUM: 143 meq/L (ref 137–147)

## 2014-05-01 LAB — MAGNESIUM: MAGNESIUM: 2.3 mg/dL (ref 1.5–2.5)

## 2014-05-01 MED ORDER — SODIUM CHLORIDE 0.9 % IV SOLN
INTRAVENOUS | Status: DC
  Administered 2014-05-01 – 2014-05-02 (×3): via INTRAVENOUS

## 2014-05-01 NOTE — Progress Notes (Signed)
Clinical Social Work Progress Note PSYCHIATRY SERVICE LINE 05/01/2014  Patient:  Alejandro Braun [facility]  Account:  [id number]  [place] Date:  04/29/2014  Clinical Social Worker:  Sindy Messing, LCSW  Date/Time:  05/01/2014 11:15 AM  Review of Patient  Overall Medical Condition:   Patient was discussed during progression meeting and MD reports patient is not medically stable to DC today but possibly stable tomorrow.   Participation Level:  Active  Participation Quality  Appropriate   Other Participation Quality:   Patient engaged in session and agreeable for girlfriend to be involved.   Affect  Appropriate   Cognitive  Appropriate   Reaction to Medications/Concerns:   None reported   Modes of Intervention  Support   Summary of Progress/Plan at Discharge   CSW reviewed chart which stated that psych MD is recommending inpatient psych placement. Patient currently under IVC and RN aware of DC plans.    CSW met with patient and girlfriend at bedside. Patient reports that he is tired because he did not get much rest last night but is feeling better emotionally and physically. Patient reports that he has regained his appetite and just finished breakfast. CSW explained no discharge today and explained inpatient recommendation. Patient reports he wishes he could follow up on outpatient basis but understanding of inpatient recommendation. Patient denies any active SI or HI and reports he and girlfriend have been talking about ways to improve their relationship.    CSW will continue to follow and will assist with placement once medically stable.     [place], [place] [phone]

## 2014-05-01 NOTE — Consult Note (Signed)
Psychiatry Consult follow up note  Reason for Consult:  overdose of Benadryl with intention of killing himself Referring Physician:  Rise Patience, MD  Alejandro Braun is an 23 y.o. male. Total Time spent with patient: 45 minutes  Assessment: AXIS I:  Depressive Disorder NOS AXIS II:  Deferred AXIS III:   Past Medical History  Diagnosis Date  . Medical history non-contributory    AXIS IV:  other psychosocial or environmental problems, problems related to social environment and problems with access to health care services AXIS V:  41-50 serious symptoms  Plan: Case will be discussed with Sindy Messing. LCSW Supportive therapy provided about ongoing stressors. Refer to IOP.  Appreciate psychiatric consultation and follow up as clinically required Please contact [phone] or [phone] if needs further assistance  Subjective:   Alejandro Braun is a 23 y.o. male patient admitted with overdose of Benadryl with intention of killing himself.  HPI:  Alejandro Braun is a 23 y.o. male seen and chart reviewed for psych consulation and evaluation of depression and status post suicidal attempt with overdose on benadryl and Zquil. Patient stated that he wants to end his life because of severe psychosocial and financial stresses. He has an argument with his GF x 2 1/2 years regarding moving out of his mother's home and saving money instead of spending on his mother utility bills etc. His GF saw him briefly after overdose and called EMS. Patient has no past history of mental illness and medical conditions. He works in Doctor, general practice. He does not know his biological father and close to his mother and grand mother. His mother is out of town for work.   Interval history: Patient has been feeling better and requesting to be refer to out patient care as he has to go back to work and mean while his fiance and brother can be closely watching for few days or as long  as he needs. He has contracted for safety at this time. He denied current symptoms of depression, anxiety and suicide or homicide ideation, intention or plans. His fiance at bed side seems to be supportive and request the same.   Medical History: Patient with no significant past medical history was brought to the ER after patient's friend found that patient was confused and told that he has taken overdose of Benadryl with intention of killing himself. Patient's friend stated that patient initially took [place] and later Benadryl itself. The quantity was not known but he had consumed almost whole bottle of Benadryl tablets. Other medications were not taken along with it as per the information. In the ER patient was found to be tachycardic and has gradually become more confused. Poison control at this time as requested supportive care with IV fluids and if patient becomes very tachycardic or hypertensive or agitated then to give Ativan as needed and also to watch out for QRS widening. Patient on my exam is confused but follows commands and is oriented to his name only.   Past Psychiatric History: Past Medical History  Diagnosis Date  . Medical history non-contributory     reports that he has never smoked. He does not have any smokeless tobacco history on file. He reports that he drinks about 1.2 oz of alcohol per week. He reports that he does not use illicit drugs. Family History  Problem Relation Age of Onset  . Diabetes type I Mother      Living Arrangements: Spouse/significant other  Abuse/Neglect [facility]) Physical Abuse: Denies Verbal Abuse: Denies Sexual Abuse: Denies Allergies:  No Known Allergies  ACT Assessment Complete:  NO Objective: Blood pressure 134/71, pulse 87, temperature 98.1 F (36.7 C), temperature source Oral, resp. rate 18, height $RemoveBe'5\' 10"'[id number]$  (1.778 m), weight 91.8 kg (202 lb 6.1 oz), SpO2 99 %.Body mass index is 29.04 kg/(m^2). Results for orders placed or performed during the  hospital encounter of 04/29/14 (from the past 72 hour(s))  CBC     Status: Abnormal   Collection Time: 04/29/14  4:07 PM  Result Value Ref Range   WBC 10.2 4.0 - 10.5 K/uL   RBC 5.55 4.22 - 5.81 MIL/uL   Hemoglobin 14.6 13.0 - 17.0 g/dL   HCT 42.6 39.0 - 52.0 %   MCV 76.8 (L) 78.0 - 100.0 fL   MCH 26.3 26.0 - 34.0 pg   MCHC 34.3 30.0 - 36.0 g/dL   RDW 12.7 11.5 - 15.5 %   Platelets 257 150 - 400 K/uL  Comprehensive metabolic panel     Status: Abnormal   Collection Time: 04/29/14  4:07 PM  Result Value Ref Range   Sodium 149 (H) 137 - 147 mEq/L   Potassium 3.4 (L) 3.7 - 5.3 mEq/L   Chloride 107 96 - 112 mEq/L   CO2 23 19 - 32 mEq/L   Glucose, Bld 137 (H) 70 - 99 mg/dL   BUN 10 6 - 23 mg/dL   Creatinine, Ser 1.24 0.50 - 1.35 mg/dL   Calcium 9.9 8.4 - 10.5 mg/dL   Total Protein 8.2 6.0 - 8.3 g/dL   Albumin 4.7 3.5 - 5.2 g/dL   AST 35 0 - 37 U/L   ALT 32 0 - 53 U/L   Alkaline Phosphatase 84 39 - 117 U/L   Total Bilirubin <0.2 (L) 0.3 - 1.2 mg/dL   GFR calc non Af Amer 81 (L) >90 mL/min   GFR calc Af Amer >90 >90 mL/min    Comment: (NOTE) The eGFR has been calculated using the CKD EPI equation. This calculation has not been validated in all clinical situations. eGFR's persistently <90 mL/min signify possible Chronic Kidney Disease.    Anion gap 19 (H) 5 - 15  Ethanol (ETOH)     Status: None   Collection Time: 04/29/14  4:07 PM  Result Value Ref Range   Alcohol, Ethyl (B) <11 0 - 11 mg/dL    Comment:        LOWEST DETECTABLE LIMIT FOR SERUM ALCOHOL IS 11 mg/dL FOR MEDICAL PURPOSES ONLY   Acetaminophen level     Status: None   Collection Time: 04/29/14  4:07 PM  Result Value Ref Range   Acetaminophen (Tylenol), Serum <15.0 10 - 30 ug/mL    Comment:        THERAPEUTIC CONCENTRATIONS VARY SIGNIFICANTLY. A RANGE OF 10-30 ug/mL MAY BE AN EFFECTIVE CONCENTRATION FOR MANY PATIENTS. HOWEVER, SOME ARE BEST TREATED AT CONCENTRATIONS OUTSIDE THIS RANGE. ACETAMINOPHEN  CONCENTRATIONS >150 ug/mL AT 4 HOURS AFTER INGESTION AND >50 ug/mL AT 12 HOURS AFTER INGESTION ARE OFTEN ASSOCIATED WITH TOXIC REACTIONS.   Salicylate level     Status: Abnormal   Collection Time: 04/29/14  4:07 PM  Result Value Ref Range   Salicylate Lvl <0.9 (L) 2.8 - 20.0 mg/dL  CBG monitoring, ED     Status: None   Collection Time: 04/29/14  4:10 PM  Result Value Ref Range   Glucose-Capillary 81 70 - 99 mg/dL   Comment 1 Notify RN  Urine rapid drug screen (hosp performed)     Status: None   Collection Time: 04/29/14  6:33 PM  Result Value Ref Range   Opiates NONE DETECTED NONE DETECTED   Cocaine NONE DETECTED NONE DETECTED   Benzodiazepines NONE DETECTED NONE DETECTED   Amphetamines NONE DETECTED NONE DETECTED   Tetrahydrocannabinol NONE DETECTED NONE DETECTED   Barbiturates NONE DETECTED NONE DETECTED    Comment:        DRUG SCREEN FOR MEDICAL PURPOSES ONLY.  IF CONFIRMATION IS NEEDED FOR ANY PURPOSE, NOTIFY LAB WITHIN 5 DAYS.        LOWEST DETECTABLE LIMITS FOR URINE DRUG SCREEN Drug Class       Cutoff (ng/mL) Amphetamine      1000 Barbiturate      200 Benzodiazepine   767 Tricyclics       209 Opiates          300 Cocaine          300 THC              50   MRSA PCR Screening     Status: None   Collection Time: 04/29/14  9:05 PM  Result Value Ref Range   MRSA by PCR NEGATIVE NEGATIVE    Comment:        The GeneXpert MRSA Assay (FDA approved for NASAL specimens only), is one component of a comprehensive MRSA colonization surveillance program. It is not intended to diagnose MRSA infection nor to guide or monitor treatment for MRSA infections.   Basic metabolic panel     Status: Abnormal   Collection Time: 04/29/14  9:27 PM  Result Value Ref Range   Sodium 143 137 - 147 mEq/L   Potassium 3.8 3.7 - 5.3 mEq/L   Chloride 106 96 - 112 mEq/L   CO2 23 19 - 32 mEq/L   Glucose, Bld 91 70 - 99 mg/dL   BUN 9 6 - 23 mg/dL   Creatinine, Ser 1.25 0.50 - 1.35  mg/dL   Calcium 9.6 8.4 - 10.5 mg/dL   GFR calc non Af Amer 80 (L) >90 mL/min   GFR calc Af Amer >90 >90 mL/min    Comment: (NOTE) The eGFR has been calculated using the CKD EPI equation. This calculation has not been validated in all clinical situations. eGFR's persistently <90 mL/min signify possible Chronic Kidney Disease.    Anion gap 14 5 - 15  Magnesium     Status: None   Collection Time: 04/29/14  9:27 PM  Result Value Ref Range   Magnesium 1.9 1.5 - 2.5 mg/dL  Acetaminophen level     Status: None   Collection Time: 04/29/14  9:27 PM  Result Value Ref Range   Acetaminophen (Tylenol), Serum <15.0 10 - 30 ug/mL    Comment:        THERAPEUTIC CONCENTRATIONS VARY SIGNIFICANTLY. A RANGE OF 10-30 ug/mL MAY BE AN EFFECTIVE CONCENTRATION FOR MANY PATIENTS. HOWEVER, SOME ARE BEST TREATED AT CONCENTRATIONS OUTSIDE THIS RANGE. ACETAMINOPHEN CONCENTRATIONS >150 ug/mL AT 4 HOURS AFTER INGESTION AND >50 ug/mL AT 12 HOURS AFTER INGESTION ARE OFTEN ASSOCIATED WITH TOXIC REACTIONS.   Salicylate level     Status: Abnormal   Collection Time: 04/29/14  9:27 PM  Result Value Ref Range   Salicylate Lvl <4.7 (L) 2.8 - 20.0 mg/dL  CBC     Status: Abnormal   Collection Time: 04/29/14  9:27 PM  Result Value Ref Range   WBC 6.1 4.0 - 10.5  K/uL   RBC 5.15 4.22 - 5.81 MIL/uL   Hemoglobin 13.2 13.0 - 17.0 g/dL   HCT 39.5 39.0 - 52.0 %   MCV 76.7 (L) 78.0 - 100.0 fL   MCH 25.6 (L) 26.0 - 34.0 pg   MCHC 33.4 30.0 - 36.0 g/dL   RDW 12.8 11.5 - 15.5 %   Platelets 266 150 - 400 K/uL  TSH     Status: None   Collection Time: 04/29/14  9:27 PM  Result Value Ref Range   TSH 2.510 0.350 - 4.500 uIU/mL    Comment: Performed at [facility]  Glucose, capillary     Status: None   Collection Time: 04/29/14  9:56 PM  Result Value Ref Range   Glucose-Capillary 96 70 - 99 mg/dL   Comment 1 Repeat Test   Basic metabolic panel     Status: Abnormal   Collection Time: 04/30/14  1:05 AM   Result Value Ref Range   Sodium 143 137 - 147 mEq/L   Potassium 3.7 3.7 - 5.3 mEq/L   Chloride 105 96 - 112 mEq/L   CO2 24 19 - 32 mEq/L   Glucose, Bld 102 (H) 70 - 99 mg/dL   BUN 8 6 - 23 mg/dL   Creatinine, Ser 1.29 0.50 - 1.35 mg/dL   Calcium 9.6 8.4 - 10.5 mg/dL   GFR calc non Af Amer 77 (L) >90 mL/min   GFR calc Af Amer 89 (L) >90 mL/min    Comment: (NOTE) The eGFR has been calculated using the CKD EPI equation. This calculation has not been validated in all clinical situations. eGFR's persistently <90 mL/min signify possible Chronic Kidney Disease.    Anion gap 14 5 - 15  Urinalysis, Routine w reflex microscopic     Status: None   Collection Time: 04/30/14  3:13 AM  Result Value Ref Range   Color, Urine YELLOW YELLOW   APPearance CLEAR CLEAR   Specific Gravity, Urine 1.006 1.005 - 1.030   pH 6.5 5.0 - 8.0   Glucose, UA NEGATIVE NEGATIVE mg/dL   Hgb urine dipstick NEGATIVE NEGATIVE   Bilirubin Urine NEGATIVE NEGATIVE   Ketones, ur NEGATIVE NEGATIVE mg/dL   Protein, ur NEGATIVE NEGATIVE mg/dL   Urobilinogen, UA 0.2 0.0 - 1.0 mg/dL   Nitrite NEGATIVE NEGATIVE   Leukocytes, UA NEGATIVE NEGATIVE    Comment: MICROSCOPIC NOT DONE ON URINES WITH NEGATIVE PROTEIN, BLOOD, LEUKOCYTES, NITRITE, OR GLUCOSE <1000 mg/dL.  Basic metabolic panel     Status: Abnormal   Collection Time: 04/30/14  5:10 AM  Result Value Ref Range   Sodium 141 137 - 147 mEq/L   Potassium 3.5 (L) 3.7 - 5.3 mEq/L   Chloride 102 96 - 112 mEq/L   CO2 25 19 - 32 mEq/L   Glucose, Bld 101 (H) 70 - 99 mg/dL   BUN 8 6 - 23 mg/dL   Creatinine, Ser 1.39 (H) 0.50 - 1.35 mg/dL   Calcium 9.8 8.4 - 10.5 mg/dL   GFR calc non Af Amer 70 (L) >90 mL/min   GFR calc Af Amer 82 (L) >90 mL/min    Comment: (NOTE) The eGFR has been calculated using the CKD EPI equation. This calculation has not been validated in all clinical situations. eGFR's persistently <90 mL/min signify possible Chronic Kidney Disease.    Anion  gap 14 5 - 15  Hepatic function panel     Status: None   Collection Time: 04/30/14  5:10 AM  Result Value  Ref Range   Total Protein 7.7 6.0 - 8.3 g/dL   Albumin 4.4 3.5 - 5.2 g/dL   AST 30 0 - 37 U/L   ALT 27 0 - 53 U/L   Alkaline Phosphatase 80 39 - 117 U/L   Total Bilirubin 0.3 0.3 - 1.2 mg/dL   Bilirubin, Direct <0.2 0.0 - 0.3 mg/dL   Indirect Bilirubin NOT CALCULATED 0.3 - 0.9 mg/dL  CBC with Differential     Status: Abnormal   Collection Time: 04/30/14  5:10 AM  Result Value Ref Range   WBC 7.0 4.0 - 10.5 K/uL   RBC 5.53 4.22 - 5.81 MIL/uL   Hemoglobin 13.9 13.0 - 17.0 g/dL   HCT 42.4 39.0 - 52.0 %   MCV 76.7 (L) 78.0 - 100.0 fL   MCH 25.1 (L) 26.0 - 34.0 pg   MCHC 32.8 30.0 - 36.0 g/dL   RDW 13.1 11.5 - 15.5 %   Platelets 275 150 - 400 K/uL   Neutrophils Relative % 56 43 - 77 %   Neutro Abs 3.9 1.7 - 7.7 K/uL   Lymphocytes Relative 36 12 - 46 %   Lymphs Abs 2.5 0.7 - 4.0 K/uL   Monocytes Relative 6 3 - 12 %   Monocytes Absolute 0.4 0.1 - 1.0 K/uL   Eosinophils Relative 2 0 - 5 %   Eosinophils Absolute 0.1 0.0 - 0.7 K/uL   Basophils Relative 0 0 - 1 %   Basophils Absolute 0.0 0.0 - 0.1 K/uL  Basic metabolic panel     Status: Abnormal   Collection Time: 05/01/14  5:00 AM  Result Value Ref Range   Sodium 143 137 - 147 mEq/L   Potassium 4.8 3.7 - 5.3 mEq/L    Comment: DELTA CHECK NOTED REPEATED TO VERIFY    Chloride 103 96 - 112 mEq/L   CO2 27 19 - 32 mEq/L   Glucose, Bld 102 (H) 70 - 99 mg/dL   BUN 11 6 - 23 mg/dL   Creatinine, Ser 1.73 (H) 0.50 - 1.35 mg/dL   Calcium 10.0 8.4 - 10.5 mg/dL   GFR calc non Af Amer 54 (L) >90 mL/min   GFR calc Af Amer 63 (L) >90 mL/min    Comment: (NOTE) The eGFR has been calculated using the CKD EPI equation. This calculation has not been validated in all clinical situations. eGFR's persistently <90 mL/min signify possible Chronic Kidney Disease.    Anion gap 13 5 - 15  Magnesium     Status: None   Collection Time: 05/01/14   5:00 AM  Result Value Ref Range   Magnesium 2.3 1.5 - 2.5 mg/dL   Labs are reviewed.  Current Facility-Administered Medications  Medication Dose Route Frequency Provider Last Rate Last Dose  . 0.9 %  sodium chloride infusion   Intravenous Continuous Nishant Dhungel, MD 125 mL/hr at 05/01/14 0846    . acetaminophen (TYLENOL) tablet 650 mg  650 mg Oral Q6H PRN Rise Patience, MD       Or  . acetaminophen (TYLENOL) suppository 650 mg  650 mg Rectal Q6H PRN Rise Patience, MD      . ondansetron [facility]) tablet 4 mg  4 mg Oral Q6H PRN Rise Patience, MD       Or  . ondansetron [facility]) injection 4 mg  4 mg Intravenous Q6H PRN Rise Patience, MD      . sodium chloride 0.9 % bolus 1,000 mL  1,000 mL Intravenous  Once Mariea Clonts, MD   Stopped at 04/29/14 1908  . sodium chloride 0.9 % injection 3 mL  3 mL Intravenous Q12H Rise Patience, MD      . thiamine (B-1) injection 100 mg  100 mg Intravenous Daily Rise Patience, MD   100 mg at 05/01/14 1051    Psychiatric Specialty Exam: Physical Exam as per history and physical  ROS depressions, stresses and impulsive overdose  Blood pressure 134/71, pulse 87, temperature 98.1 F (36.7 C), temperature source Oral, resp. rate 18, height _0  (1.778 m), weight 91.8 kg (202 lb 6.1 oz), SpO2 99 %.Body mass index is 29.04 kg/(m^2).  General Appearance: Disheveled and Guarded  Engineer, water::  Fair  Speech:  Clear and Coherent and Slow  Volume:  Decreased  Mood:  Depressed  Affect:  Depressed  Thought Process:  Coherent and Goal Directed  Orientation:  Full (Time, Place, and Person)  Thought Content:  Rumination  Suicidal Thoughts:  Yes.  with intent/plan  Homicidal Thoughts:  No  Memory:  Immediate;   Fair Recent;   Fair  Judgement:  Impaired  Insight:  Lacking  Psychomotor Activity:  Decreased  Concentration:  Fair  Recall:  [employer] of Knowledge:Fair  Language: Good  Akathisia:  NA  Handed:  Right  AIMS  (if indicated):     Assets:  Communication Skills Desire for Improvement Intimacy Leisure Time Physical Health Resilience Social Support Talents/Skills  Sleep:      Musculoskeletal: Strength & Muscle Tone: decreased Gait & Station: unable to stand Patient leans: N/A  Treatment Plan Summary: Daily contact with patient to assess and evaluate symptoms and progress in treatment Medication management  Recommend psych intensive out patient treatment and than out patient treatment as he is contracting for safety and his family seems to be very supportive to him.   Lempi Edwin,JANARDHAHA R. 05/01/2014 4:55 PM

## 2014-05-01 NOTE — Progress Notes (Signed)
[facility] PROGRESS NOTE  Alejandro Braun Alejandro Braun AVW:[id number] DOB: 07/18/1990 DOA: 04/29/2014 PCP: No primary care provider on file.  Assessment/Plan: Intentional drug overdose with Benadryl with associated acute encephalopathy Vitals stable on telemetry. No neurological deficit. EKG repeated showed Qtc prolongation. Replenished low  k. normal mg. Check Repeat EKG.  -he denies further suicidal ideation. No History of depression or previous suicidal ideations or attempts. Psychiatry consulted ad recommend transfer to inpt psych once medically cleared.  Continue sitter  Pt refusing to be admitted to inpt psych saying he is no longer suicidal and can follow as outpt. Will ask Psych to reassess while in hospital.   Hypokalemia replenished  Acute kidney injury Possibly due to dehydration. Monitor with IV fluids. Avoid nephrotoxins.    DVT prophylaxis: Subcutaneous Lovenox Diet: Regular  Code Status: Full code Family Communication:  girlfriend at bedside  Disposition Plan: inpt psych on 11/25 , if stable on telemetry and renal fn improved. Ask psych to  reassess pt.   Consultants:  Psychiatry  Procedures:  None  Antibiotics:  None  HPI/Subjective: pt seen and examined . Denies any chest pain, palpitations, SOB, N/V. Denies suicidal ideations  Objective: Filed Vitals:   05/01/14 1352  BP: 134/71  Pulse: 87  Temp: 98.1 F (36.7 C)  Resp: 18    Intake/Output Summary (Last 24 hours) at 05/01/14 1353 Last data filed at 05/01/14 0330  Gross per 24 hour  Intake   2105 ml  Output      0 ml  Net   2105 ml   Filed Weights   04/29/14 2300  Weight: 91.8 kg (202 lb 6.1 oz)    Exam:   General:  Young male in no acute distress  HEENT: No pallor, moist oral mucosa, normal pupils  Chest: Clear to auscultation bilaterally  CVS: Normal S1 and S2, no murmurs  Abdomen: Soft, nondistended, nontender  Images: Warm, no edema  CNS: Alert and oriented      Data Reviewed: Basic Metabolic Panel:  Recent Labs Lab 04/29/14 1607 04/29/14 2127 04/30/14 0105 04/30/14 0510 05/01/14 0500  NA 149* 143 143 141 143  K 3.4* 3.8 3.7 3.5* 4.8  CL 107 106 105 102 103  CO2 23 23 24 25 27   GLUCOSE 137* 91 102* 101* 102*  BUN 10 9 8 8 11   CREATININE 1.24 1.25 1.29 1.39* 1.73*  CALCIUM 9.9 9.6 9.6 9.8 10.0  MG  --  1.9  --   --  2.3   Liver Function Tests:  Recent Labs Lab 04/29/14 1607 04/30/14 0510  AST 35 30  ALT 32 27  ALKPHOS 84 80  BILITOT <0.2* 0.3  PROT 8.2 7.7  ALBUMIN 4.7 4.4   No results for input(s): LIPASE, AMYLASE in the last 168 hours. No results for input(s): AMMONIA in the last 168 hours. CBC:  Recent Labs Lab 04/29/14 1607 04/29/14 2127 04/30/14 0510  WBC 10.2 6.1 7.0  NEUTROABS  --   --  3.9  HGB 14.6 13.2 13.9  HCT 42.6 39.5 42.4  MCV 76.8* 76.7* 76.7*  PLT 257 266 275   Cardiac Enzymes: No results for input(s): CKTOTAL, CKMB, CKMBINDEX, TROPONINI in the last 168 hours. BNP (last 3 results) No results for input(s): PROBNP in the last 8760 hours. CBG:  Recent Labs Lab 04/29/14 1610 04/29/14 2156  GLUCAP 81 96    Recent Results (from the past 240 hour(s))  MRSA PCR Screening     Status: None   Collection Time:  04/29/14  9:05 PM  Result Value Ref Range Status   MRSA by PCR NEGATIVE NEGATIVE Final    Comment:        The GeneXpert MRSA Assay (FDA approved for NASAL specimens only), is one component of a comprehensive MRSA colonization surveillance program. It is not intended to diagnose MRSA infection nor to guide or monitor treatment for MRSA infections.      Studies: Dg Chest Port 1 View  04/30/2014   CLINICAL DATA:  Initial evaluation for acute fever.  EXAM: PORTABLE CHEST - 1 VIEW  COMPARISON:  None.  FINDINGS: The cardiac and mediastinal silhouettes are within normal limits.  The lungs are normally inflated. No airspace consolidation, pleural effusion, or pulmonary edema is  identified. There is no pneumothorax.  No acute osseous abnormality identified.  IMPRESSION: No active cardiopulmonary disease.   Electronically Signed   By: Rise MuBenjamin  McClintock M.D.   On: 04/30/2014 03:28    Scheduled Meds: . sodium chloride  1,000 mL Intravenous Once  . sodium chloride  3 mL Intravenous Q12H  . thiamine IV  100 mg Intravenous Daily   Continuous Infusions: . sodium chloride 125 mL/hr at 05/01/14 0846      Time spent: 25 minutes    Madgie Dhaliwal  [facility] Pager [phone] If 7PM-7AM, please contact night-coverage at [email or web address], password [email or web address] 05/01/2014, 1:53 PM  LOS: 2 days

## 2014-05-02 LAB — BASIC METABOLIC PANEL
ANION GAP: 10 (ref 5–15)
BUN: 12 mg/dL (ref 6–23)
CO2: 26 meq/L (ref 19–32)
CREATININE: 1.58 mg/dL — AB (ref 0.50–1.35)
Calcium: 9 mg/dL (ref 8.4–10.5)
Chloride: 107 mEq/L (ref 96–112)
GFR calc Af Amer: 70 mL/min — ABNORMAL LOW (ref >90)
GFR calc non Af Amer: 60 mL/min — ABNORMAL LOW (ref >90)
Glucose, Bld: 95 mg/dL (ref 70–99)
Potassium: 4.6 mEq/L (ref 3.7–5.3)
Sodium: 143 mEq/L (ref 137–147)

## 2014-05-02 LAB — MAGNESIUM: MAGNESIUM: 2.1 mg/dL (ref 1.5–2.5)

## 2014-05-02 NOTE — Plan of Care (Signed)
Problem: Phase I Progression Outcomes Goal: Pain controlled with appropriate interventions Outcome: Completed/Met Date Met:  05/02/14 Goal: OOB as tolerated unless otherwise ordered Outcome: Completed/Met Date Met:  05/02/14 Goal: Voiding-avoid urinary catheter unless indicated Outcome: Completed/Met Date Met:  05/02/14     

## 2014-05-02 NOTE — Discharge Summary (Signed)
Physician Discharge Summary  Alejandro Braun DOB: 08/01/1990 DOA: 04/29/2014  PCP: No primary care provider on file.  Admit date: 04/29/2014 Discharge date: 05/02/2014  Recommendations for Outpatient Follow-up:  1. Pt will need to follow up with PCP in 2-3 weeks post discharge 2. Outpatient psych follow up recommended and pt is agreement, wants to go home, denies SI  Discharge Diagnoses:  Active Problems:   Drug overdose   Suicide ideation   Acute encephalopathy   Hypernatremia   AKI (acute kidney injury)    Discharge Condition: Stable  Diet recommendation: Heart healthy diet discussed in details   History of present illness:  Pt is 23 yo male with no PMH, presented with AMS after intentional benadryl OD>   Hospital Course:   Intentional drug overdose with Benadryl with associated acute encephalopathy Vitals stable on telemetry. No neurological deficit. EKG repeated showed Qtc prolongation. Replenished low k. normal mg. -he denies further suicidal ideation. No History of depression or previous suicidal ideations or attempts. Psychiatry consulted add recommend outpatient psych follow up.  -stable for d/c   Hypokalemia replenished  Acute kidney injury Possibly due to dehydration. Cr trending down   Procedures/Studies: Dg Chest Port 1 View  04/30/2014   CLINICAL DATA:  Initial evaluation for acute fever.  EXAM: PORTABLE CHEST - 1 VIEW  COMPARISON:  None.  FINDINGS: The cardiac and mediastinal silhouettes are within normal limits.  The lungs are normally inflated. No airspace consolidation, pleural effusion, or pulmonary edema is identified. There is no pneumothorax.  No acute osseous abnormality identified.  IMPRESSION: No active cardiopulmonary disease.   Electronically Signed   By: Rise MuBenjamin  McClintock M.D.   On: 04/30/2014 03:28   Consultations:  Psych   Antibiotics:  None   Discharge Exam: Filed Vitals:   05/02/14 0549  BP: 126/76   Pulse: 71  Temp: 98.1 F (36.7 C)  Resp: 20   Filed Vitals:   05/01/14 0620 05/01/14 1352 05/01/14 2228 05/02/14 0549  BP: 128/62 134/71 132/65 126/76  Pulse: 83 87 59 71  Temp: 98.8 F (37.1 C) 98.1 F (36.7 C) 98.4 F (36.9 C) 98.1 F (36.7 C)  TempSrc: Oral Oral Oral Oral  Resp: 16 18 20 20   Height:      Weight:      SpO2: 99% 99% 100% 100%    General: Pt is alert, follows commands appropriately, not in acute distress Cardiovascular: Regular rate and rhythm, S1/S2 +, no murmurs, no rubs, no gallops Respiratory: Clear to auscultation bilaterally, no wheezing, no crackles, no rhonchi Abdominal: Soft, non tender, non distended, bowel sounds +, no guarding Extremities: no edema, no cyanosis, pulses palpable bilaterally DP and PT Neuro: Grossly nonfocal  Discharge Instructions  Discharge Instructions    Diet - low sodium heart healthy    Complete by:  As directed      Increase activity slowly    Complete by:  As directed             Medication List    TAKE these medications        ibuprofen 200 MG tablet  Commonly known as:  ADVIL,MOTRIN  Take 400 mg by mouth every 6 (six) hours as needed for headache.           Follow-up Information    Follow up with Debbora PrestoMAGICK-Chalonda Schlatter, MD.   Specialty:  Internal Medicine   Why:  As needed, If symptoms worsen   Contact information:   [address]  [address] [place] [address] [phone]        The results of significant diagnostics from this hospitalization (including imaging, microbiology, ancillary and laboratory) are listed below for reference.     Microbiology: Recent Results (from the past 240 hour(s))  MRSA PCR Screening     Status: None   Collection Time: 04/29/14  9:05 PM  Result Value Ref Range Status   MRSA by PCR NEGATIVE NEGATIVE Final    Comment:        The GeneXpert MRSA Assay (FDA approved for NASAL specimens only), is one component of a comprehensive MRSA  colonization surveillance program. It is not intended to diagnose MRSA infection nor to guide or monitor treatment for MRSA infections.      Labs: Basic Metabolic Panel:  Recent Labs Lab 04/29/14 2127 04/30/14 0105 04/30/14 0510 05/01/14 0500 05/02/14 0521  NA 143 143 141 143 143  K 3.8 3.7 3.5* 4.8 4.6  CL 106 105 102 103 107  CO2 23 24 25 27 26   GLUCOSE 91 102* 101* 102* 95  BUN 9 8 8 11 12   CREATININE 1.25 1.29 1.39* 1.73* 1.58*  CALCIUM 9.6 9.6 9.8 10.0 9.0  MG 1.9  --   --  2.3 2.1   Liver Function Tests:  Recent Labs Lab 04/29/14 1607 04/30/14 0510  AST 35 30  ALT 32 27  ALKPHOS 84 80  BILITOT <0.2* 0.3  PROT 8.2 7.7  ALBUMIN 4.7 4.4   No results for input(s): LIPASE, AMYLASE in the last 168 hours. No results for input(s): AMMONIA in the last 168 hours. CBC:  Recent Labs Lab 04/29/14 1607 04/29/14 2127 04/30/14 0510  WBC 10.2 6.1 7.0  NEUTROABS  --   --  3.9  HGB 14.6 13.2 13.9  HCT 42.6 39.5 42.4  MCV 76.8* 76.7* 76.7*  PLT 257 266 275   Cardiac Enzymes: No results for input(s): CKTOTAL, CKMB, CKMBINDEX, TROPONINI in the last 168 hours. BNP: BNP (last 3 results) No results for input(s): PROBNP in the last 8760 hours. CBG:  Recent Labs Lab 04/29/14 1610 04/29/14 2156  GLUCAP 81 96     SIGNED: Time coordinating discharge: Over 30 minutes  Debbora PrestoMAGICK-Maylee Bare, MD  [facility] 05/02/2014, 8:38 AM Pager [phone]  If 7PM-7AM, please contact night-coverage [email or web address] Password [email or web address]

## 2014-05-02 NOTE — Progress Notes (Signed)
Discharge instructions given to pt, verbalized understanding. Left the unit in stable condition. 

## 2014-05-02 NOTE — Discharge Instructions (Signed)
°  Sedative Ingestion An overdose is when more drugs are taken than recommended. The risk of serious problems from overdosing on any sedative depends on the amount of drug taken and whether it is mixed with other drugs or alcohol. The most common group of sedatives are benzodiazepines, including:  Lorazepam.  Flurazepam.  Triazolam.  Chlordiazepoxide.  Oxazepam.  Diazepam.  Alprazolam. Sedatives may be prescribed for insomnia, anxiety, muscle tension, and alcohol or drug withdrawal symptoms. SYMPTOMS A sedative overdose causes symptoms similar to alcohol intoxication. These include:  Loss of coordination.  Slurred speech.  Slowed breathing.  Poor judgment.  Memory loss.  Drowsiness.  Blackouts.  Coma. Taking too many sedatives can cause:  Respiratory depression.  Vomiting.  Dehydration.  Low blood pressure.  Death. HOME CARE INSTRUCTIONS  At this point, hospital care is not needed.  You are at an increased risk for injury when on sedative drugs, especially when you drive or operate machinery. It is very important that someone watches you closely for the next 24-48 hours and calls for emergency help if you have trouble breathing or cannot be awakened from sleep.  You may have a hangover after sedative ingestion. Get plenty of rest and drink increased amounts of non-alcoholic fluids.  A sedative ingestion is often a sign of a severe emotional state or depression. If you have been taking a sedative medicine regularly for a long time and stop suddenly, you may have withdrawal symptoms, including anxiety, agitation, headache, and more serious symptoms. Be sure to see your doctor or counselor for further treatment to address these emotional and physical issues. SEEK IMMEDIATE MEDICAL CARE IF:   You develop recurrent dizziness or weakness or you faint.  You have trouble breathing.  You have a seizure. Document Released: 07/02/2004 Document Revised: 08/17/2011  Document Reviewed: 05/29/2009 [facility] Patient Information 2015 [place], [place]. This information is not intended to replace advice given to you by your health care provider. Make sure you discuss any questions you have with your health care provider.

## 2014-05-02 NOTE — Plan of Care (Signed)
Problem: Discharge Progression Outcomes Goal: Discharge plan in place and appropriate Outcome: Completed/Met Date Met:  05/02/14 Goal: Pain controlled with appropriate interventions Outcome: Completed/Met Date Met:  05/02/14 Goal: Hemodynamically stable Outcome: Completed/Met Date Met:  85/46/27 Goal: Complications resolved/controlled Outcome: Completed/Met Date Met:  05/02/14 Goal: Tolerating diet Outcome: Completed/Met Date Met:  05/02/14 Goal: Activity appropriate for discharge plan Outcome: Completed/Met Date Met:  05/02/14 Goal: Other Discharge Outcomes/Goals Outcome: Completed/Met Date Met:  05/02/14

## 2014-05-02 NOTE — Progress Notes (Signed)
Clinical Social Work Progress Note PSYCHIATRY SERVICE LINE 05/02/2014  Patient:  Alejandro Braun [facility]  Account:  [id number]  [place] Date:  04/29/2014  Clinical Social Worker:  Sindy Messing, LCSW  Date/Time:  05/02/2014 10:20 AM  Review of Patient  Overall Medical Condition:   Patient medically stable to DC.   Participation Level:  Active  Participation Quality  Appropriate   Other Participation Quality:   Patient engaged in assessment.   Affect  Appropriate   Cognitive  Appropriate   Reaction to Medications/Concerns:   None reported   Modes of Intervention  Support   Summary of Progress/Plan at Discharge   Patient was discussed during progression meeting and attending MD reports patient can DC today. CSW staffed case with psych MD on 11/24 who is agreeable for patient to follow up with Intensive outpatient program at [facility]. MD signed "Notice of Commitment Change" form to rescind IVC which was faxed to Magistrate and original copy placed in chart.    CSW met with patient and girlfriend at bedside. Patient reports he slept well last night and is ready to DC. Patient continues to deny any SI or HI and contracts for safety. Patient reports that he and family have developed a plan in order to ensure his safety as well.    CSW provided information for IOP and scholarship forms since patient does not have insurance. CSW explained if patient is unable to obtain a scholarship for services then he could follow up with an agency like [place] or [facility] for individual counseling. CSW also provided patient with Mobile Crisis number in case he was in crisis again. Patient and girlfriend report understanding and thanked CSW for information. Girlfriend reports she has benefits through her job and CSW encouraged girlfriend to see if employer offers EAP for couples counseling as well.    CSW informed RN of DC plans and is signing off. Please call if any further needs  arise.     [place], [place] [phone]

## 2017-03-05 ENCOUNTER — Ambulatory Visit (HOSPITAL_COMMUNITY)
Admission: EM | Admit: 2017-03-05 | Discharge: 2017-03-05 | Disposition: A | Discharge status: Home / Self Care | Payer: BLUE CROSS/BLUE SHIELD | Attending: Internal Medicine | Admitting: Internal Medicine

## 2017-03-05 ENCOUNTER — Encounter (HOSPITAL_COMMUNITY): Payer: Self-pay | Admitting: *Deleted

## 2017-03-05 DIAGNOSIS — H9201 Otalgia, right ear: Secondary | ICD-10-CM

## 2017-03-05 MED ORDER — AMOXICILLIN 875 MG PO TABS: 875.0000 mg | ORAL_TABLET | Freq: Two times a day (BID) | ORAL | 0 refills | Status: AC

## 2017-03-05 NOTE — ED Triage Notes (Signed)
r   Ear  Clogged  X   4  Days   No  Other   Symptoms    Slight  Pain  But  Not  Too  Bad

## 2017-03-06 NOTE — ED Provider Notes (Signed)
[facility]    CSN: [id number] Arrival date & time: 03/05/17  1040     History   Chief Complaint Chief Complaint  Patient presents with  . Otalgia    HPI Alejandro Braun is a 26 y.o. male.   Pt c/o right ear pain x3 days. Denies fever, N/V.       Past Medical History:  Diagnosis Date  . Medical history non-contributory     Patient Active Problem List   Diagnosis Date Noted  . AKI (acute kidney injury) (HCC)   . Drug overdose 04/29/2014  . Suicide ideation 04/29/2014  . Acute encephalopathy 04/29/2014  . Hypernatremia 04/29/2014    Past Surgical History:  Procedure Laterality Date  . NO PAST SURGERIES         Home Medications    Prior to Admission medications   Medication Sig Start Date End Date Taking? Authorizing Provider  amoxicillin (AMOXIL) 875 MG tablet Take 1 tablet (875 mg total) by mouth 2 (two) times daily. 03/05/17 03/15/17  Arnaldo Natal, MD  ibuprofen (ADVIL,MOTRIN) 200 MG tablet Take 400 mg by mouth every 6 (six) hours as needed for headache.    [provider]    Family History Family History  Problem Relation Age of Onset  . Diabetes type I Mother     Social History Social History  Substance Use Topics  . Smoking status: Never Smoker  . Smokeless tobacco: Not on file  . Alcohol use 1.2 oz/week    2 Shots of liquor per week     Comment: 2or more times a week     Allergies   Patient has no known allergies.   Review of Systems Review of Systems  Constitutional: Negative for chills and fever.  HENT: Positive for ear pain. Negative for sore throat and tinnitus.   Eyes: Negative for redness.  Respiratory: Negative for cough and shortness of breath.   Cardiovascular: Negative for chest pain and palpitations.  Gastrointestinal: Negative for abdominal pain, diarrhea, nausea and vomiting.  Genitourinary: Negative for dysuria, frequency and urgency.  Musculoskeletal: Negative for myalgias.    Skin: Negative for rash.       No lesions  Neurological: Negative for weakness.  Hematological: Does not bruise/bleed easily.  Psychiatric/Behavioral: Negative for suicidal ideas.     Physical Exam Triage Vital Signs ED Triage Vitals  Enc Vitals Group     BP 03/05/17 1108 132/82     Pulse Rate 03/05/17 1108 72     Resp 03/05/17 1108 18     Temp 03/05/17 1108 98.6 F (37 C)     Temp Source 03/05/17 1108 Oral     SpO2 03/05/17 1108 100 %     Weight --      Height --      Head Circumference --      Peak Flow --      Pain Score 03/05/17 1106 2     Pain Loc --      Pain Edu? --      Excl. in GC? --    No data found.   Updated Vital Signs BP 132/82 (BP Location: Right Arm)   Pulse 72   Temp 98.6 F (37 C) (Oral)   Resp 18   SpO2 100%   Visual Acuity Right Eye Distance:   Left Eye Distance:   Bilateral Distance:    Right Eye Near:   Left Eye Near:    Bilateral Near:  Physical Exam  Constitutional: He is oriented to person, place, and time. He appears well-developed and well-nourished. No distress.  HENT:  Head: Normocephalic and atraumatic.  Right Ear: Tympanic membrane is injected. A middle ear effusion is present.  Left Ear: A middle ear effusion is present.  Mouth/Throat: Oropharynx is clear and moist.  Eyes: Pupils are equal, round, and reactive to light. Conjunctivae and EOM are normal. No scleral icterus.  Neck: Normal range of motion. Neck supple. No JVD present. No tracheal deviation present. No thyromegaly present.  Cardiovascular: Normal rate, regular rhythm and normal heart sounds.  Exam reveals no gallop and no friction rub.   No murmur heard. Pulmonary/Chest: Effort normal and breath sounds normal. No respiratory distress.  Abdominal: Soft. Bowel sounds are normal. He exhibits no distension. There is no tenderness.  Musculoskeletal: Normal range of motion. He exhibits no edema.  Lymphadenopathy:    He has no cervical adenopathy.   Neurological: He is alert and oriented to person, place, and time. No cranial nerve deficit.  Skin: Skin is warm and dry. No rash noted. No erythema.  Psychiatric: He has a normal mood and affect. His behavior is normal. Judgment and thought content normal.     UC Treatments / Results  Labs (all labs ordered are listed, but only abnormal results are displayed) Labs Reviewed - No data to display  EKG  EKG Interpretation None       Radiology No results found.  Procedures Procedures (including critical care time)  Medications Ordered in UC Medications - No data to display   Initial Impression / Assessment and Plan / UC Course  I have reviewed the triage vital signs and the nursing notes.  Pertinent labs & imaging results that were available during my care of the patient were reviewed by me and considered in my medical decision making (see chart for details).     No perforation.   Final Clinical Impressions(s) / UC Diagnoses   Final diagnoses:  Otalgia of right ear    New Prescriptions Discharge Medication List as of 03/05/2017 12:23 PM    START taking these medications   Details  amoxicillin (AMOXIL) 875 MG tablet Take 1 tablet (875 mg total) by mouth 2 (two) times daily., Starting Fri 03/05/2017, Until Mon 03/15/2017, Normal         Controlled Substance Prescriptions [place] Controlled Substance Registry consulted? Not Applicable   Arnaldo Natal, MD 03/06/17 1421

## 2018-04-21 ENCOUNTER — Other Ambulatory Visit: Payer: Self-pay

## 2018-04-21 ENCOUNTER — Encounter (HOSPITAL_BASED_OUTPATIENT_CLINIC_OR_DEPARTMENT_OTHER): Payer: Self-pay | Admitting: *Deleted

## 2018-04-21 ENCOUNTER — Emergency Department (HOSPITAL_BASED_OUTPATIENT_CLINIC_OR_DEPARTMENT_OTHER)
Admission: EM | Admit: 2018-04-21 | Discharge: 2018-04-21 | Disposition: A | Discharge status: Home / Self Care | Payer: Worker's Compensation | Attending: Emergency Medicine | Admitting: Emergency Medicine

## 2018-04-21 ENCOUNTER — Emergency Department (HOSPITAL_BASED_OUTPATIENT_CLINIC_OR_DEPARTMENT_OTHER): Payer: Worker's Compensation

## 2018-04-21 DIAGNOSIS — Z23 Encounter for immunization: Secondary | ICD-10-CM | POA: Diagnosis not present

## 2018-04-21 DIAGNOSIS — Y990 Civilian activity done for income or pay: Secondary | ICD-10-CM | POA: Insufficient documentation

## 2018-04-21 DIAGNOSIS — Y929 Unspecified place or not applicable: Secondary | ICD-10-CM | POA: Insufficient documentation

## 2018-04-21 DIAGNOSIS — W268XXA Contact with other sharp object(s), not elsewhere classified, initial encounter: Secondary | ICD-10-CM | POA: Insufficient documentation

## 2018-04-21 DIAGNOSIS — S61212A Laceration without foreign body of right middle finger without damage to nail, initial encounter: Secondary | ICD-10-CM

## 2018-04-21 DIAGNOSIS — Y93G1 Activity, food preparation and clean up: Secondary | ICD-10-CM | POA: Insufficient documentation

## 2018-04-21 DIAGNOSIS — S6991XA Unspecified injury of right wrist, hand and finger(s), initial encounter: Secondary | ICD-10-CM | POA: Diagnosis present

## 2018-04-21 MED ORDER — BACITRACIN ZINC 500 UNIT/GM EX OINT
TOPICAL_OINTMENT | Freq: Once | CUTANEOUS | Status: AC
  Administered 2018-04-21: 1 via TOPICAL
  Filled 2018-04-21: qty 28.35

## 2018-04-21 MED ORDER — BUPIVACAINE HCL 0.5 % IJ SOLN
50.0000 mL | Freq: Once | INTRAMUSCULAR | Status: AC
  Administered 2018-04-21: 50 mL
  Filled 2018-04-21: qty 1

## 2018-04-21 MED ORDER — TETANUS-DIPHTH-ACELL PERTUSSIS 5-2.5-18.5 LF-MCG/0.5 IM SUSP
0.5000 mL | Freq: Once | INTRAMUSCULAR | Status: AC
  Administered 2018-04-21: 0.5 mL via INTRAMUSCULAR
  Filled 2018-04-21: qty 0.5

## 2018-04-21 NOTE — Discharge Instructions (Signed)
Keep the wound clean and dry for the first 24 hours. After that you may gently clean the wound with soap and water. Make sure to pat dry the wound before covering it with any dressing. You can use topical antibiotic ointment and bandage. Ice and elevate for pain relief.  ° °You can take Tylenol or Ibuprofen as directed for pain. You can alternate Tylenol and Ibuprofen every 4 hours for additional pain relief.  ° °Return to the Emergency Department, your primary care doctor, or the [facility] in 5-7 days for suture removal.  ° °Monitor closely for any signs of infection. Return to the Emergency Department for any worsening redness/swelling of the area that begins to spread, drainage from the site, worsening pain, fever or any other worsening or concerning symptoms.  ° ° °

## 2018-04-21 NOTE — ED Notes (Signed)
UDS completed 

## 2018-04-21 NOTE — ED Provider Notes (Signed)
[facility] EMERGENCY DEPARTMENT Provider Note   CSN: [id number] Arrival date & time: 04/21/18  1410     History   Chief Complaint Chief Complaint  Patient presents with  . Laceration    HPI Alejandro Braun is a 27 y.o. male presents for evaluation of right middle finger laceration that occurred approximately 1 PM this afternoon while at work.  Patient reports that he was using a bread slicer states that he accidentally cut the distal tip of his right index finger.  He states that he does not know when his last tetanus shot.  He denies any numbness/weakness.  The history is provided by the patient.    Past Medical History:  Diagnosis Date  . Medical history non-contributory     Patient Active Problem List   Diagnosis Date Noted  . AKI (acute kidney injury) (HCC)   . Drug overdose 04/29/2014  . Suicide ideation 04/29/2014  . Acute encephalopathy 04/29/2014  . Hypernatremia 04/29/2014    Past Surgical History:  Procedure Laterality Date  . NO PAST SURGERIES          Home Medications    Prior to Admission medications   Medication Sig Start Date End Date Taking? Authorizing Provider  ibuprofen (ADVIL,MOTRIN) 200 MG tablet Take 400 mg by mouth every 6 (six) hours as needed for headache.    [provider]    Family History Family History  Problem Relation Age of Onset  . Diabetes type I Mother     Social History Social History   Tobacco Use  . Smoking status: Never Smoker  . Smokeless tobacco: Never Used  Substance Use Topics  . Alcohol use: Yes    Alcohol/week: 2.0 standard drinks    Types: 2 Shots of liquor per week    Comment: 2or more times a week  . Drug use: No     Allergies   Patient has no known allergies.   Review of Systems Review of Systems  Skin: Positive for wound.  Neurological: Negative for weakness and numbness.  All other systems reviewed and are negative.    Physical Exam Updated Vital  Signs BP 133/85 (BP Location: Left Arm)   Pulse 86   Temp 99 F (37.2 C) (Oral)   Resp 16   Ht 5' 8.5" (1.74 m)   Wt 97.5 kg   SpO2 100%   BMI 32.22 kg/m   Physical Exam  Constitutional: He appears well-developed and well-nourished.  HENT:  Head: Normocephalic and atraumatic.  Eyes: Conjunctivae and EOM are normal. Right eye exhibits no discharge. Left eye exhibits no discharge. No scleral icterus.  Cardiovascular:  Pulses:      Radial pulses are 2+ on the right side, and 2+ on the left side.  Pulmonary/Chest: Effort normal.  Musculoskeletal:  Patient can easily make a fist with all 5 fingers of the right hand.  Flexion/extension of both the PIP and DIP of the right middle digit intact with held in isolation.  Neurological: He is alert.  Sensation intact along major nerve distributions of RUE  Skin: Skin is warm and dry. Capillary refill takes less than 2 seconds.  Circumlinear laceration to the tip of the right middle finger that extends on the lateral side of the pad and comes around just to the nail.  It does not extend into the nailbed.  No nail injury.  Good distal cap refill noted to the tip of the right middle finger.  Right middle finger is not  dusky in appearance are cool to touch.  Psychiatric: He has a normal mood and affect. His speech is normal and behavior is normal.  Nursing note and vitals reviewed.    ED Treatments / Results  Labs (all labs ordered are listed, but only abnormal results are displayed) Labs Reviewed - No data to display  EKG None  Radiology Dg Hand Complete Right  Result Date: 04/21/2018 CLINICAL DATA:  Cut right third digit at work EXAM: RIGHT HAND - COMPLETE 3+ VIEW COMPARISON:  None. FINDINGS: No opaque foreign body is seen. No significant laceration is evident. No bony abnormality is seen. On the lateral view there is a small bony density proximal to the middle phalanx of the right third digit which appears corticated and probably is  old. IMPRESSION: No acute fracture.  No opaque foreign body. Electronically Signed   By: Dwyane DeePaul  Barry M.D.   On: 04/21/2018 14:29    Procedures .Marland Kitchen.Laceration Repair Date/Time: 04/21/2018 4:00 PM Performed by: Maxwell CaulLayden,  A, PA-C Authorized by: Maxwell CaulLayden,  A, PA-C   Consent:    Consent obtained:  Verbal   Consent given by:  Patient   Risks discussed:  Infection, need for additional repair, pain, poor cosmetic result and poor wound healing   Alternatives discussed:  No treatment and delayed treatment Universal protocol:    Procedure explained and questions answered to patient or proxy's satisfaction: yes     Relevant documents present and verified: yes     Test results available and properly labeled: yes     Imaging studies available: yes     Required blood products, implants, devices, and special equipment available: yes     Site/side marked: yes     Immediately prior to procedure, a time out was called: yes     Patient identity confirmed:  Verbally with patient Anesthesia (see MAR for exact dosages):    Anesthesia method:  Local infiltration Laceration details:    Location:  Finger   Finger location:  R long finger   Length (cm):  1.5 Repair type:    Repair type:  Simple Pre-procedure details:    Preparation:  Patient was prepped and draped in usual sterile fashion Exploration:    Hemostasis achieved with:  Direct pressure   Wound extent: no foreign bodies/material noted and no tendon damage noted     Contaminated: no   Treatment:    Area cleansed with:  Betadine   Amount of cleaning:  Extensive   Irrigation solution:  Sterile saline   Irrigation method:  Syringe   Visualized foreign bodies/material removed: no   Skin repair:    Repair method:  Sutures   Suture size:  5-0   Suture material:  Nylon   Suture technique:  Simple interrupted   Number of sutures:  7 Approximation:    Approximation:  Close Post-procedure details:    Dressing:  Antibiotic ointment  and non-adherent dressing   Patient tolerance of procedure:  Tolerated well, no immediate complications Comments:     Once the wound was anesthetized, was thoroughly and extensively irrigated with sterile saline.  Full exploration of the wound showed no evidence of foreign body.   (including critical care time)  Medications Ordered in ED Medications  Tdap (BOOSTRIX) injection 0.5 mL (0.5 mLs Intramuscular Given 04/21/18 1447)  bupivacaine (MARCAINE) 0.5 % (with pres) injection 50 mL (50 mLs Infiltration Given 04/21/18 1447)  bacitracin ointment (1 application Topical Given 04/21/18 1534)     Initial Impression / Assessment and  Plan / ED Course  I have reviewed the triage vital signs and the nursing notes.  Pertinent labs & imaging results that were available during my care of the patient were reviewed by me and considered in my medical decision making (see chart for details).     27 year old male who presents for evaluation of right middle finger laceration that occurred earlier today while at work.  Reports he got it stuck in a bread slicer. Patient is afebrile, non-toxic appearing, sitting comfortably on examination table. Vital signs reviewed and stable.  Patient is neurovascularly intact.  Patient with laceration of the distal tip of the right middle finger.  It does not extend into the nailbed and no obvious nail injury.  Plan for wound care, update tetanus in the ED.  X-ray ordered at triage.  X-ray reviewed.  No evidence of bony abnormality.   Laceration repaired as documented above.  Patient tolerated procedure well.  Discussed wound care precautions with patient.  Additionally, I discussed that given the tip of the finger, there is a high chance of decreased vascularity.  We will give him outpatient hand follow-up for further outpatient evaluation. Patient had ample opportunity for questions and discussion. All patient's questions were answered with full understanding. Strict  return precautions discussed. Patient expresses understanding and agreement to plan.   Final Clinical Impressions(s) / ED Diagnoses   Final diagnoses:  Laceration of right middle finger without foreign body without damage to nail, initial encounter    ED Discharge Orders    None       Rosana Hoes 04/21/18 1609    Tilden Fossa, MD 04/22/18 [phone]

## 2018-04-21 NOTE — ED Triage Notes (Signed)
Laceration to his right middle finger at work today. UDS required.

## 2018-04-28 ENCOUNTER — Emergency Department (HOSPITAL_BASED_OUTPATIENT_CLINIC_OR_DEPARTMENT_OTHER)
Admission: EM | Admit: 2018-04-28 | Discharge: 2018-04-28 | Disposition: A | Discharge status: Home / Self Care | Payer: BLUE CROSS/BLUE SHIELD | Attending: Emergency Medicine | Admitting: Emergency Medicine

## 2018-04-28 ENCOUNTER — Other Ambulatory Visit: Payer: Self-pay

## 2018-04-28 ENCOUNTER — Encounter (HOSPITAL_BASED_OUTPATIENT_CLINIC_OR_DEPARTMENT_OTHER): Payer: Self-pay | Admitting: *Deleted

## 2018-04-28 DIAGNOSIS — Z5189 Encounter for other specified aftercare: Secondary | ICD-10-CM

## 2018-04-28 DIAGNOSIS — X58XXXD Exposure to other specified factors, subsequent encounter: Secondary | ICD-10-CM | POA: Diagnosis not present

## 2018-04-28 DIAGNOSIS — S61212D Laceration without foreign body of right middle finger without damage to nail, subsequent encounter: Secondary | ICD-10-CM | POA: Insufficient documentation

## 2018-04-28 NOTE — ED Triage Notes (Signed)
Pt requests removal of sutures placed to right middle finger 7 days ago. Denies any pain or other c/o. dsd in place at triage.

## 2018-04-28 NOTE — ED Provider Notes (Signed)
[facility] EMERGENCY DEPARTMENT Provider Note   CSN: [id number] Arrival date & time: 04/28/18  1049     History   Chief Complaint Chief Complaint  Patient presents with  . Suture / Staple Removal    HPI Alejandro Braun is a 27 y.o. male presenting to the emergency department for wound recheck and suture removal.  Patient was seen on 04/21/2018 with laceration to right middle finger that occurred while using a bread slicer at work.  Wound was irrigated and closed with 7 simple interrupted sutures.  Tetanus was updated.  It was noted that area of skin distal to laceration may have decreased vascularity. He was provided hand referral for follow up. He denies any signs of infection.  Denies fever or purulent drainage.  Minimal pain.  The history is provided by the patient.    Past Medical History:  Diagnosis Date  . Medical history non-contributory     Patient Active Problem List   Diagnosis Date Noted  . AKI (acute kidney injury) (HCC)   . Drug overdose 04/29/2014  . Suicide ideation 04/29/2014  . Acute encephalopathy 04/29/2014  . Hypernatremia 04/29/2014    Past Surgical History:  Procedure Laterality Date  . NO PAST SURGERIES          Home Medications    Prior to Admission medications   Medication Sig Start Date End Date Taking? Authorizing Provider  ibuprofen (ADVIL,MOTRIN) 200 MG tablet Take 400 mg by mouth every 6 (six) hours as needed for headache.    [provider]    Family History Family History  Problem Relation Age of Onset  . Diabetes type I Mother     Social History Social History   Tobacco Use  . Smoking status: Never Smoker  . Smokeless tobacco: Never Used  Substance Use Topics  . Alcohol use: Yes    Alcohol/week: 2.0 standard drinks    Types: 2 Shots of liquor per week    Comment: 2or more times a week  . Drug use: No     Allergies   Patient has no known allergies.   Review of Systems Review of  Systems  Constitutional: Negative for fever.  Skin: Positive for wound.     Physical Exam Updated Vital Signs BP 114/72 (BP Location: Left Arm)   Pulse (!) 55   Temp 98.7 F (37.1 C) (Oral)   Resp 16   Ht 5\' 8"  (1.727 m)   Wt 97.5 kg   SpO2 100%   BMI 32.69 kg/m   Physical Exam  Constitutional: He appears well-developed and well-nourished. No distress.  HENT:  Head: Normocephalic and atraumatic.  Eyes: Conjunctivae are normal.  Cardiovascular: Intact distal pulses.  Pulmonary/Chest: Effort normal.  Musculoskeletal:  Right third digit with sutures in place. Laceration appears to be incomplete avulsion laceration with distal aspect slightly pale in appearance. Wound appears to be healing, however the wound appears very moist.  The wound edges are well approximated, however are easily separated with palpation.  There is no purulent drainage or surrounding redness.  There is no significant swelling or tenderness.   Psychiatric: He has a normal mood and affect. His behavior is normal.  Nursing note and vitals reviewed.    ED Treatments / Results  Labs (all labs ordered are listed, but only abnormal results are displayed) Labs Reviewed - No data to display  EKG None  Radiology No results found.  Procedures Procedures (including critical care time)  Medications Ordered in ED  Medications - No data to display   Initial Impression / Assessment and Plan / ED Course  I have reviewed the triage vital signs and the nursing notes.  Pertinent labs & imaging results that were available during my care of the patient were reviewed by me and considered in my medical decision making (see chart for details).     Patient presenting for wound check after laceration to right third digit on 04/21/2018.  On exam, there is appears to be an incomplete avulsion laceration to the tip of the third right digit.  Sutures are in place.  Wound does not appear infected, however edges are easily  separable with palpation.  Wound does appear moist.  Discussed proper wound care, dry clean bandages daily.  Recommend patient return in a couple of days for recheck, as I do not feel sutures are ready to be removed this time for fear of wound separation.  Also recommend follow-up with hand specialist.  Discussed results, findings, treatment and follow up. Patient advised of return precautions. Patient verbalized understanding and agreed with plan.   Final Clinical Impressions(s) / ED Diagnoses   Final diagnoses:  Visit for wound check    ED Discharge Orders    None       , [place] N, PA-C 04/28/18 1358    Little, Ambrose Finlandachel Morgan, MD 04/29/18 [phone]

## 2018-04-28 NOTE — Discharge Instructions (Signed)
Keep your wound clean and dry.  Wash it daily with soap and water.  Pat it dry and apply a clean dry bandage.  Noticed that your wound is very moist throughout the day, place your bandage with a clean one. Return to the emergency department in 2 to 3 days for suture removal.

## 2018-05-02 ENCOUNTER — Other Ambulatory Visit: Payer: Self-pay

## 2018-05-02 ENCOUNTER — Encounter (HOSPITAL_BASED_OUTPATIENT_CLINIC_OR_DEPARTMENT_OTHER): Payer: Self-pay | Admitting: Emergency Medicine

## 2018-05-02 ENCOUNTER — Emergency Department (HOSPITAL_BASED_OUTPATIENT_CLINIC_OR_DEPARTMENT_OTHER)
Admission: EM | Admit: 2018-05-02 | Discharge: 2018-05-02 | Disposition: A | Discharge status: Home / Self Care | Payer: BLUE CROSS/BLUE SHIELD | Attending: Emergency Medicine | Admitting: Emergency Medicine

## 2018-05-02 DIAGNOSIS — S61212D Laceration without foreign body of right middle finger without damage to nail, subsequent encounter: Secondary | ICD-10-CM | POA: Diagnosis not present

## 2018-05-02 DIAGNOSIS — W269XXA Contact with unspecified sharp object(s), initial encounter: Secondary | ICD-10-CM | POA: Diagnosis not present

## 2018-05-02 DIAGNOSIS — Z4802 Encounter for removal of sutures: Secondary | ICD-10-CM | POA: Diagnosis not present

## 2018-05-02 NOTE — ED Triage Notes (Signed)
Stitches to right middle to be removed.

## 2018-05-02 NOTE — ED Provider Notes (Signed)
  [facility] EMERGENCY DEPARTMENT Provider Note   CSN: [id number] Arrival date & time: 05/02/18  0846     History   Chief Complaint Chief Complaint  Patient presents with  . Suture / Staple Removal    HPI Japhet Rainey PinesDavid Pizzi is a 27 y.o. male.  Pt presents to the ED today with suture removal from right middle finger.  He initially sustained his laceration on 11/14.  He returned on 11/21 for suture removal, but it was not quite healed.  He comes back today to see if they can come out.  No redness or drainage.     Past Medical History:  Diagnosis Date  . Medical history non-contributory     Patient Active Problem List   Diagnosis Date Noted  . AKI (acute kidney injury) (HCC)   . Drug overdose 04/29/2014  . Suicide ideation 04/29/2014  . Acute encephalopathy 04/29/2014  . Hypernatremia 04/29/2014    Past Surgical History:  Procedure Laterality Date  . NO PAST SURGERIES          Home Medications    Prior to Admission medications   Medication Sig Start Date End Date Taking? Authorizing Provider  ibuprofen (ADVIL,MOTRIN) 200 MG tablet Take 400 mg by mouth every 6 (six) hours as needed for headache.    [provider]    Family History Family History  Problem Relation Age of Onset  . Diabetes type I Mother     Social History Social History   Tobacco Use  . Smoking status: Never Smoker  . Smokeless tobacco: Never Used  Substance Use Topics  . Alcohol use: Yes    Alcohol/week: 2.0 standard drinks    Types: 2 Shots of liquor per week    Comment: 2or more times a week  . Drug use: No     Allergies   Patient has no known allergies.   Review of Systems Review of Systems  Skin: Positive for wound.  All other systems reviewed and are negative.    Physical Exam Updated Vital Signs BP 121/82   Pulse 79   Temp 98.4 F (36.9 C)   Resp 16   Ht 5\' 8"  (1.727 m)   Wt 97.5 kg   BMI 32.69 kg/m   Physical Exam    Cardiovascular: Normal rate.  Pulmonary/Chest: Effort normal.  Skin: Skin is warm.  Distal finger lac well healed.     ED Treatments / Results  Labs (all labs ordered are listed, but only abnormal results are displayed) Labs Reviewed - No data to display  EKG None  Radiology No results found.  Procedures Procedures (including critical care time)  Medications Ordered in ED Medications - No data to display   Initial Impression / Assessment and Plan / ED Course  I have reviewed the triage vital signs and the nursing notes.  Pertinent labs & imaging results that were available during my care of the patient were reviewed by me and considered in my medical decision making (see chart for details).    Sutures removed.  Pt stable for d/c.  Final Clinical Impressions(s) / ED Diagnoses   Final diagnoses:  Visit for suture removal    ED Discharge Orders    None       Jacalyn LefevreHaviland, Aayat Hajjar, MD 05/02/18 [phone]

## 2018-06-02 ENCOUNTER — Encounter (HOSPITAL_BASED_OUTPATIENT_CLINIC_OR_DEPARTMENT_OTHER): Payer: Self-pay | Admitting: Emergency Medicine

## 2018-06-02 ENCOUNTER — Emergency Department (HOSPITAL_BASED_OUTPATIENT_CLINIC_OR_DEPARTMENT_OTHER): Payer: BLUE CROSS/BLUE SHIELD

## 2018-06-02 ENCOUNTER — Other Ambulatory Visit: Payer: Self-pay

## 2018-06-02 ENCOUNTER — Emergency Department (HOSPITAL_BASED_OUTPATIENT_CLINIC_OR_DEPARTMENT_OTHER)
Admission: EM | Admit: 2018-06-02 | Discharge: 2018-06-02 | Disposition: A | Discharge status: Home / Self Care | Payer: BLUE CROSS/BLUE SHIELD | Attending: Emergency Medicine | Admitting: Emergency Medicine

## 2018-06-02 DIAGNOSIS — J069 Acute upper respiratory infection, unspecified: Secondary | ICD-10-CM | POA: Insufficient documentation

## 2018-06-02 DIAGNOSIS — R05 Cough: Secondary | ICD-10-CM | POA: Diagnosis present

## 2018-06-02 LAB — GROUP A STREP BY PCR: Group A Strep by PCR: NOT DETECTED

## 2018-06-02 MED ORDER — DEXAMETHASONE 6 MG PO TABS
10.0000 mg | ORAL_TABLET | Freq: Once | ORAL | Status: AC
  Administered 2018-06-02: 10 mg via ORAL
  Filled 2018-06-02: qty 1

## 2018-06-02 MED ORDER — IBUPROFEN 800 MG PO TABS
800.0000 mg | ORAL_TABLET | Freq: Once | ORAL | Status: AC
  Administered 2018-06-02: 800 mg via ORAL
  Filled 2018-06-02: qty 1

## 2018-06-02 MED ORDER — ACETAMINOPHEN 500 MG PO TABS
1000.0000 mg | ORAL_TABLET | Freq: Once | ORAL | Status: AC | PRN
  Administered 2018-06-02: 1000 mg via ORAL
  Filled 2018-06-02: qty 2

## 2018-06-02 NOTE — ED Notes (Signed)
Patient transported to X-ray 

## 2018-06-02 NOTE — ED Notes (Signed)
Given Gatorade, instructed to force fluids

## 2018-06-02 NOTE — ED Triage Notes (Addendum)
Cough and fever, sore throat and headache since yesterday. No meds today.

## 2018-06-02 NOTE — ED Provider Notes (Signed)
[facility] EMERGENCY DEPARTMENT Provider Note   CSN: [id number] Arrival date & time: 06/02/18  1214     History   Chief Complaint Chief Complaint  Patient presents with  . Cough    HPI Athony Rainey PinesDavid Raupp is a 27 y.o. male.  The history is provided by the patient.  Fever   This is a new problem. The current episode started yesterday. The problem occurs constantly. The problem has not changed since onset.The maximum temperature noted was 101 to 101.9 F. Associated symptoms include congestion, sore throat, muscle aches and cough. Pertinent negatives include no chest pain, no fussiness and no vomiting. He has tried ibuprofen for the symptoms. The treatment provided mild relief.    Past Medical History:  Diagnosis Date  . Medical history non-contributory     Patient Active Problem List   Diagnosis Date Noted  . AKI (acute kidney injury) (HCC)   . Drug overdose 04/29/2014  . Suicide ideation 04/29/2014  . Acute encephalopathy 04/29/2014  . Hypernatremia 04/29/2014    Past Surgical History:  Procedure Laterality Date  . NO PAST SURGERIES          Home Medications    Prior to Admission medications   Medication Sig Start Date End Date Taking? Authorizing Provider  ibuprofen (ADVIL,MOTRIN) 200 MG tablet Take 400 mg by mouth every 6 (six) hours as needed for headache.    [provider]    Family History Family History  Problem Relation Age of Onset  . Diabetes type I Mother     Social History Social History   Tobacco Use  . Smoking status: Never Smoker  . Smokeless tobacco: Never Used  Substance Use Topics  . Alcohol use: Yes    Alcohol/week: 2.0 standard drinks    Types: 2 Shots of liquor per week    Comment: 2or more times a week  . Drug use: No     Allergies   Patient has no known allergies.   Review of Systems Review of Systems  Constitutional: Positive for fever. Negative for chills.  HENT: Positive for congestion  and sore throat. Negative for ear pain.   Eyes: Negative for pain and visual disturbance.  Respiratory: Positive for cough. Negative for shortness of breath.   Cardiovascular: Negative for chest pain and palpitations.  Gastrointestinal: Negative for abdominal pain and vomiting.  Genitourinary: Negative for dysuria and hematuria.  Musculoskeletal: Negative for arthralgias and back pain.  Skin: Negative for color change and rash.  Neurological: Negative for seizures and syncope.  All other systems reviewed and are negative.    Physical Exam Updated Vital Signs  ED Triage Vitals  Enc Vitals Group     BP 06/02/18 1219 (!) 141/78     Pulse Rate 06/02/18 1219 (!) 120     Resp 06/02/18 1219 (!) 24     Temp 06/02/18 1219 (!) 103.3 F (39.6 C)     Temp Source 06/02/18 1219 Oral     SpO2 06/02/18 1219 94 %     Weight 06/02/18 1217 220 lb (99.8 kg)     Height 06/02/18 1217 5\' 7"  (1.702 m)     Head Circumference --      Peak Flow --      Pain Score 06/02/18 1218 6     Pain Loc --      Pain Edu? --      Excl. in GC? --     Physical Exam Vitals signs and nursing note reviewed.  Constitutional:      Appearance: He is well-developed.  HENT:     Head: Normocephalic and atraumatic.     Right Ear: Tympanic membrane normal.     Left Ear: Tympanic membrane normal.     Nose: Nose normal.     Mouth/Throat:     Mouth: Mucous membranes are moist.     Pharynx: Posterior oropharyngeal erythema present. No oropharyngeal exudate.  Eyes:     Extraocular Movements: Extraocular movements intact.     Conjunctiva/sclera: Conjunctivae normal.     Pupils: Pupils are equal, round, and reactive to light.  Neck:     Musculoskeletal: Normal range of motion and neck supple.  Cardiovascular:     Rate and Rhythm: Regular rhythm. Tachycardia present.     Pulses: Normal pulses.     Heart sounds: Normal heart sounds. No murmur.  Pulmonary:     Effort: Pulmonary effort is normal. No respiratory distress.       Breath sounds: Normal breath sounds.  Abdominal:     General: Bowel sounds are normal.     Palpations: Abdomen is soft.     Tenderness: There is no abdominal tenderness.  Musculoskeletal: Normal range of motion.  Skin:    General: Skin is warm and dry.     Capillary Refill: Capillary refill takes less than 2 seconds.  Neurological:     General: No focal deficit present.     Mental Status: He is alert.  Psychiatric:        Mood and Affect: Mood normal.      ED Treatments / Results  Labs (all labs ordered are listed, but only abnormal results are displayed) Labs Reviewed  GROUP A STREP BY PCR    EKG None  Radiology Dg Chest 2 View  Result Date: 06/02/2018 CLINICAL DATA:  Cough, fever EXAM: CHEST - 2 VIEW COMPARISON:  04/30/2014 FINDINGS: Lungs are clear.  No pleural effusion or pneumothorax. The heart is normal in size. Visualized osseous structures are within normal limits. IMPRESSION: Normal chest radiographs. Electronically Signed   By: Charline BillsSriyesh  Krishnan M.D.   On: 06/02/2018 12:34    Procedures Procedures (including critical care time)  Medications Ordered in ED Medications  acetaminophen (TYLENOL) tablet 1,000 mg (1,000 mg Oral Given 06/02/18 1229)  dexamethasone (DECADRON) tablet 10 mg (10 mg Oral Given 06/02/18 1314)  ibuprofen (ADVIL,MOTRIN) tablet 800 mg (800 mg Oral Given 06/02/18 1315)     Initial Impression / Assessment and Plan / ED Course  I have reviewed the triage vital signs and the nursing notes.  Pertinent labs & imaging results that were available during my care of the patient were reviewed by me and considered in my medical decision making (see chart for details).     Reynard Rainey PinesDavid Braman is a 27 year old male with no significant medical history who presents to the ED with fever.  Patient with fever, tachycardia upon arrival.  Patient with symptoms of body ache, sore throat for the last 2 days.  Patient took Motrin yesterday with some  relief.  Has not taken any antipyretics today.  Has some redness in the back of his throat but otherwise no signs of ear infection on exam.  Clear breath sounds bilaterally.  Patient denies any abdominal pain, urinary symptoms.  Patient with no abdominal tenderness on exam.  Patient with negative strep screen.  Patient with no signs of pneumonia, pneumothorax and pleural effusion on chest x-ray.  Patient given Tylenol, Decadron, Motrin.  Vital signs improved.  Patient was able to drink plenty of fluids while in the ED.  Patient looks extremely well.  States body aches have improved.  Has been making urine well. Recommend increase hydration, continued fever control with Motrin, Tylenol.  Patient did not get his flu shot this year and likely has viral process.  Given reassurance and discharged from the ED in good condition.  Given return precautions.   This chart was dictated using voice recognition software.  Despite best efforts to proofread,  errors can occur which can change the documentation meaning.   Final Clinical Impressions(s) / ED Diagnoses   Final diagnoses:  Upper respiratory tract infection, unspecified type    ED Discharge Orders    None       Virgina Norfolk, DO 06/02/18 1420

## 2018-06-02 NOTE — Discharge Instructions (Addendum)
Continue to take 800 mg of Motrin every 8 hours, 1000 mg of Tylenol every 6 hours for fever control.  Increase hydration.  Return to the ED if symptoms do not improve.

## 2020-05-07 IMAGING — CR DG HAND COMPLETE 3+V*R*
3 series · 3 of 3 positions shown · non-contrast
Comparison: None.

CLINICAL DATA: Cut right third digit at work

EXAM:
RIGHT HAND - COMPLETE 3+ VIEW

[x hand pa right]
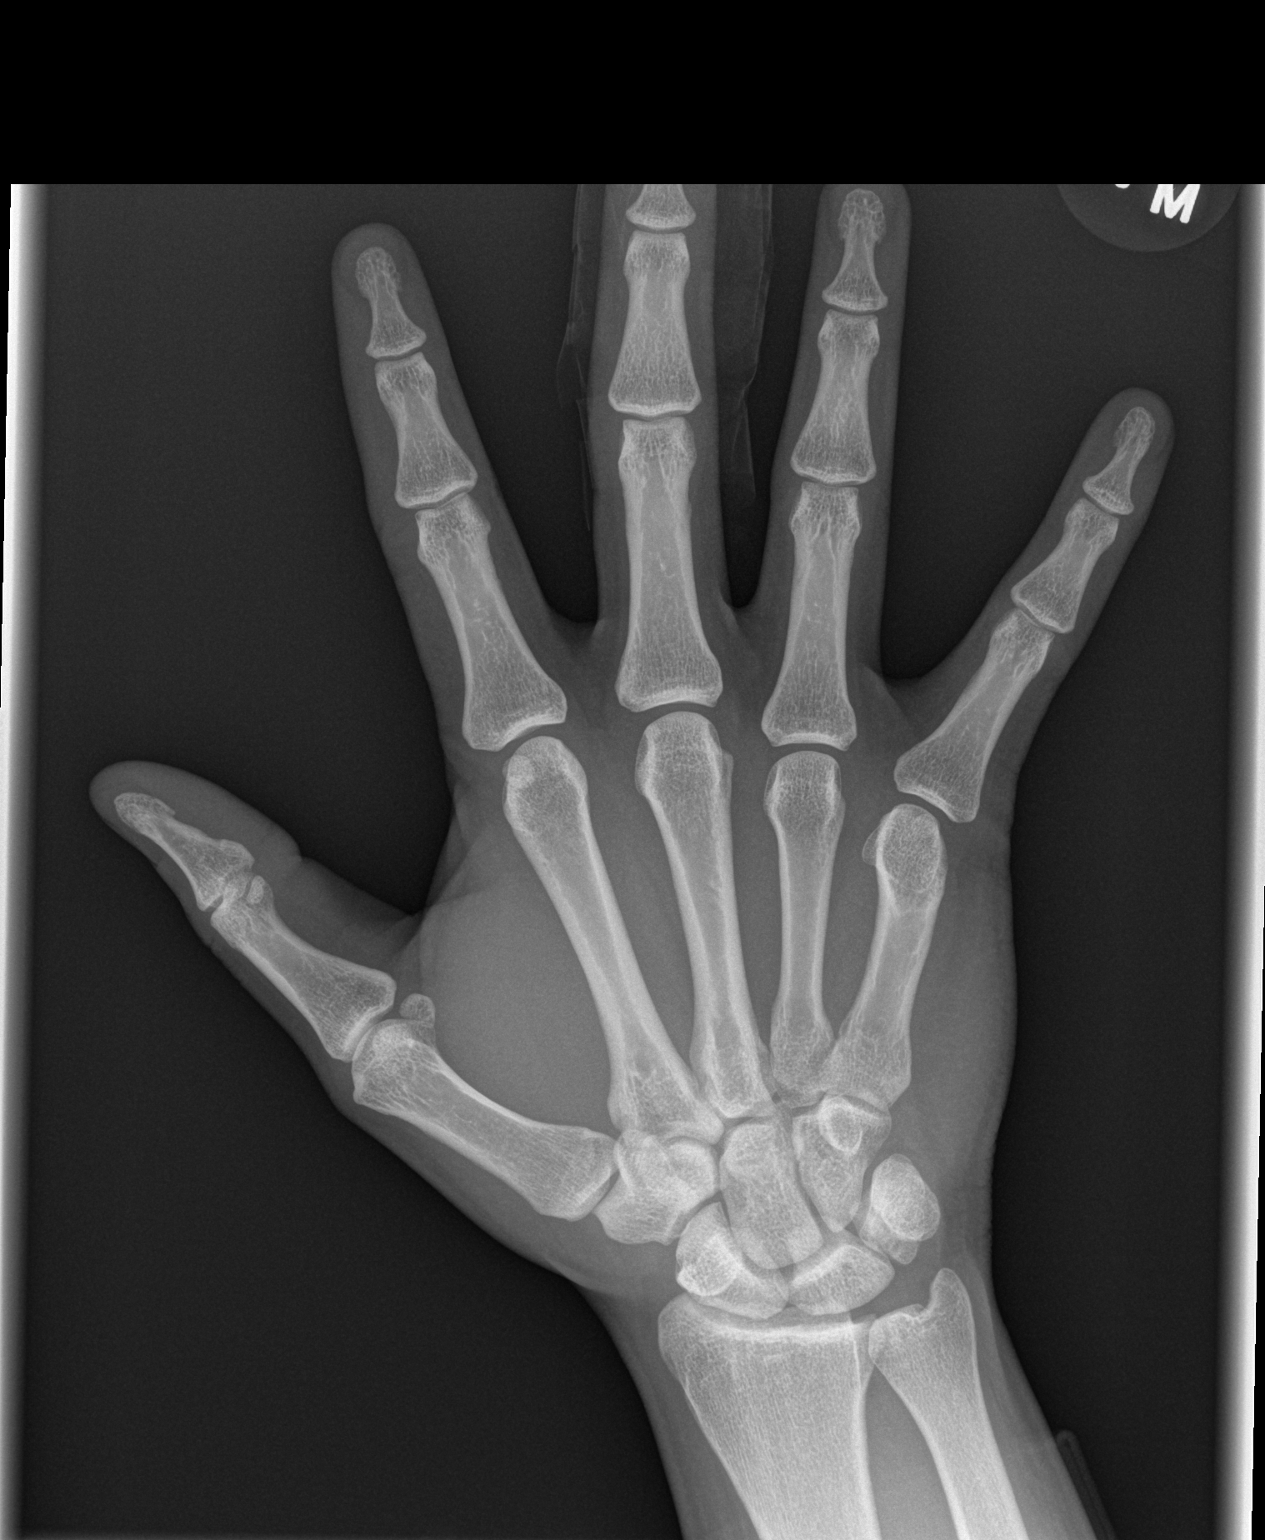

[x hand oblique right]
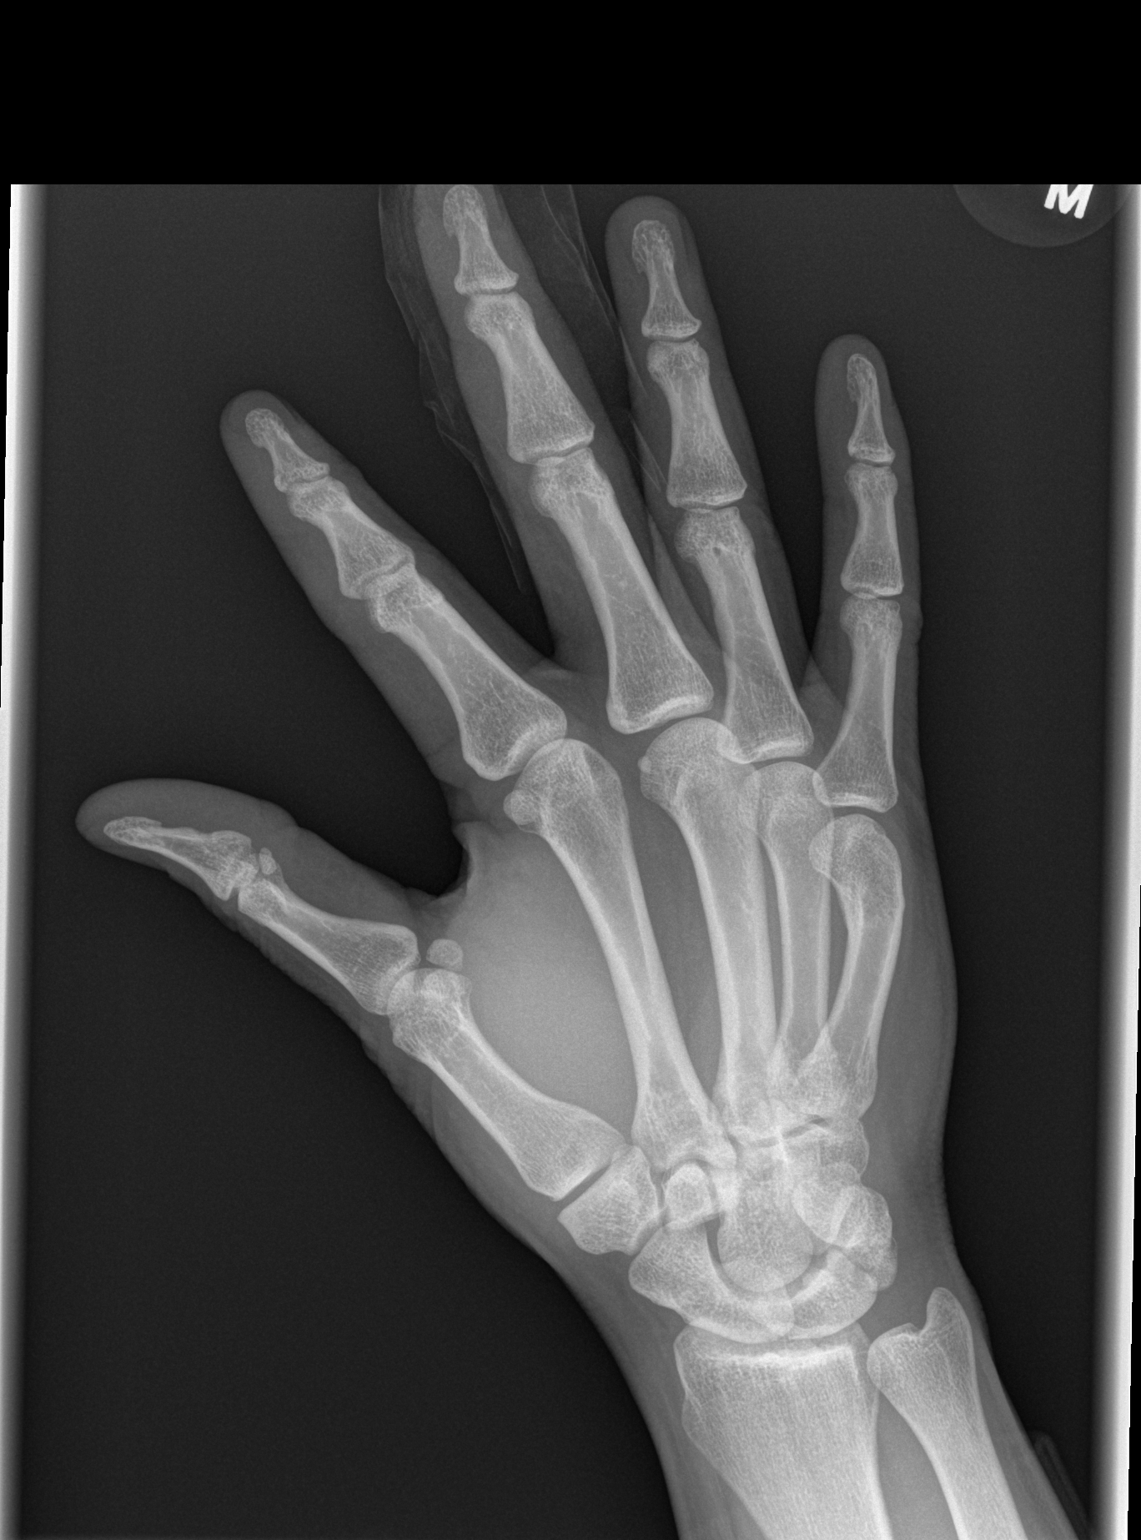

[x hand lat right]
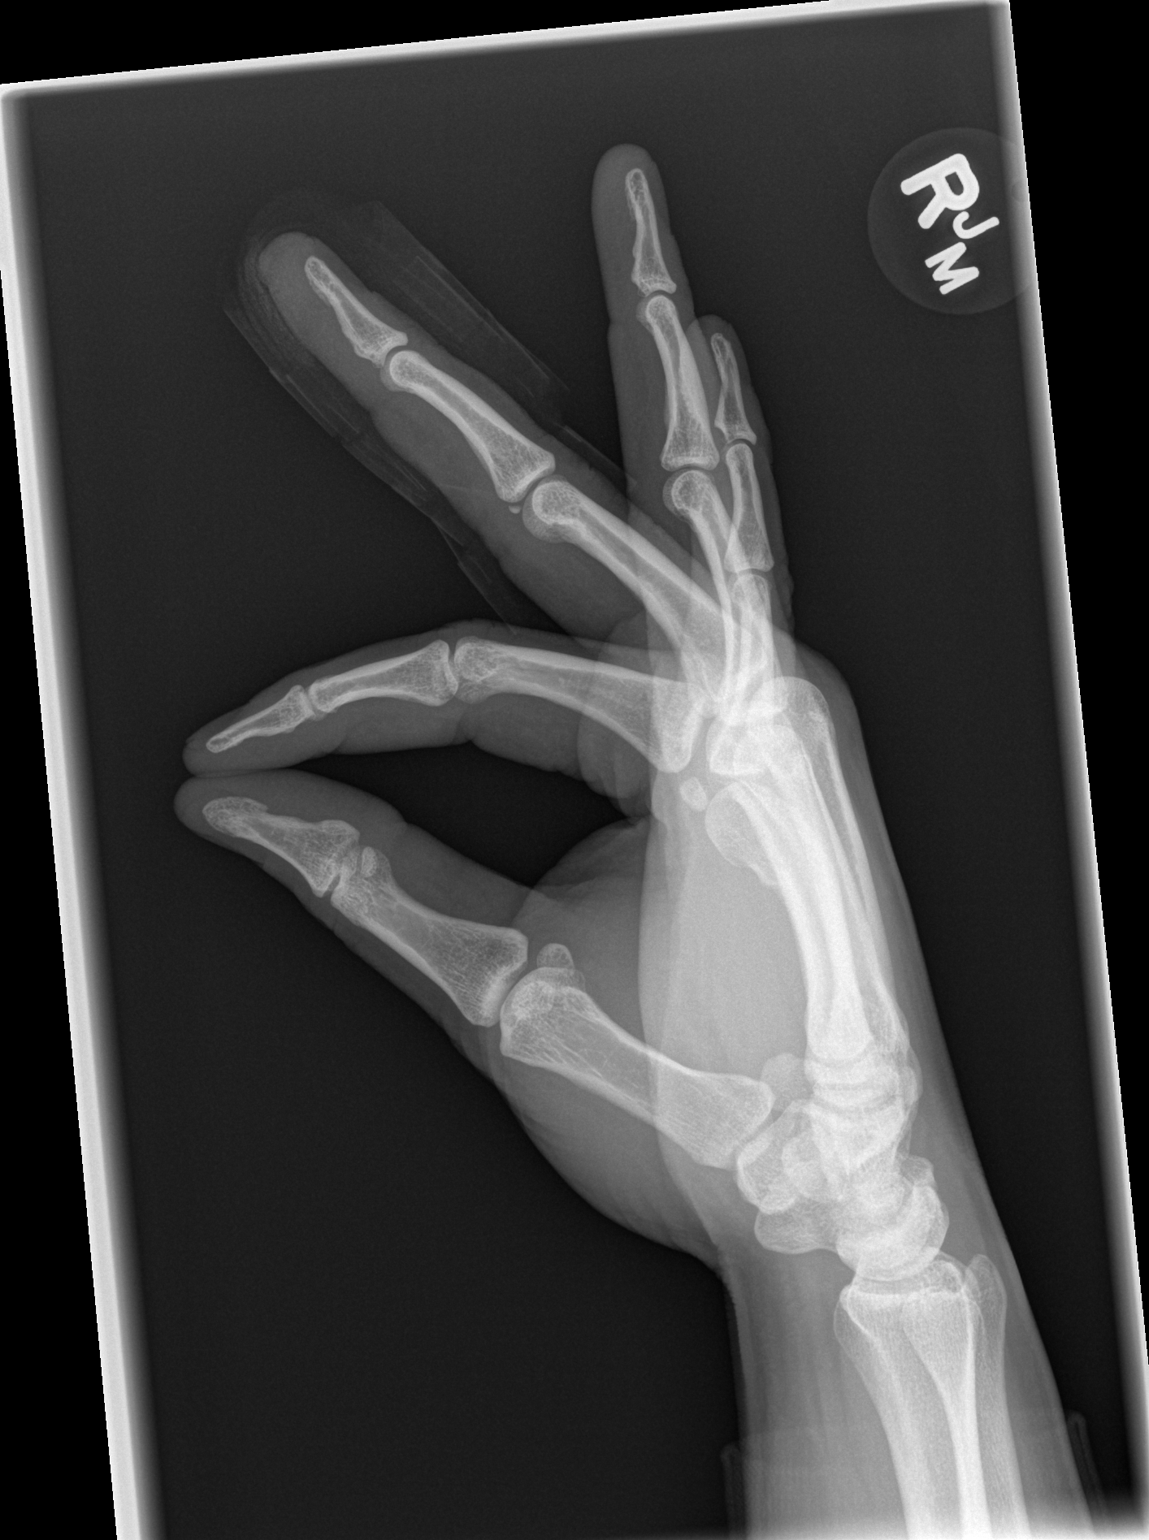

[3 of 3 positions shown; findings below may reference images not displayed]

FINDINGS: No opaque foreign body is seen. No significant laceration is
evident. No bony abnormality is seen. On the lateral view there is a
small bony density proximal to the middle phalanx of the right third
digit which appears corticated and probably is old.
IMPRESSION: No acute fracture.  No opaque foreign body.

## 2024-01-17 ENCOUNTER — Ambulatory Visit: Admission: EM | Admit: 2024-01-17 | Discharge: 2024-01-17 | Disposition: A | Discharge status: Home / Self Care

## 2024-01-17 ENCOUNTER — Encounter: Payer: Self-pay | Admitting: Emergency Medicine

## 2024-01-17 DIAGNOSIS — M722 Plantar fascial fibromatosis: Secondary | ICD-10-CM | POA: Diagnosis not present

## 2024-01-17 MED ORDER — DICLOFENAC SODIUM 50 MG PO TBEC: 50.0000 mg | DELAYED_RELEASE_TABLET | Freq: Two times a day (BID) | ORAL | 1 refills | Status: AC

## 2024-01-17 NOTE — ED Provider Notes (Signed)
 [facility]  Note:  This document was prepared using Dragon voice recognition software and may include unintentional dictation errors.  MRN: [id number] DOB: 02/15/1991  Subjective:   Alejandro Braun is a 33 y.o. male presenting for acute onset right foot pain.  Patient reports that he first noticed symptoms last night after work.  Patient states that pain is on the sole of his foot does not affect his heel or the toes.  Patient reports increased pain with ambulation or standing for long periods of time.  Patient denies any known injury or trauma to the area.  No previous chronic foot pain noted.  No current facility-administered medications for this encounter.  Current Outpatient Medications:    diclofenac  (VOLTAREN ) 50 MG EC tablet, Take 1 tablet (50 mg total) by mouth 2 (two) times daily., Disp: 30 tablet, Rfl: 1   ibuprofen  (ADVIL ,MOTRIN ) 200 MG tablet, Take 400 mg by mouth every 6 (six) hours as needed for headache., Disp: , Rfl:    No Known Allergies  Past Medical History:  Diagnosis Date   Medical history non-contributory      Past Surgical History:  Procedure Laterality Date   NO PAST SURGERIES      Family History  Problem Relation Age of Onset   Diabetes type I Mother     Social History   Tobacco Use   Smoking status: Never   Smokeless tobacco: Never  Substance Use Topics   Alcohol use: Yes    Alcohol/week: 2.0 standard drinks of alcohol    Types: 2 Shots of liquor per week    Comment: 2or more times a week   Drug use: No    ROS Refer to HPI for ROS details.  Objective:    Vitals: BP (!) 155/95 (BP Location: Right Arm)   Pulse 78   Temp 98.3 F (36.8 C) (Oral)   Resp 16   SpO2 97%   Physical Exam Vitals and nursing note reviewed.  Constitutional:      General: He is not in acute distress.    Appearance: Normal appearance. He is not ill-appearing or toxic-appearing.  HENT:     Head: Normocephalic.   Cardiovascular:     Rate and Rhythm: Normal rate.  Pulmonary:     Effort: Pulmonary effort is normal. No respiratory distress.  Musculoskeletal:     Right foot: Normal range of motion and normal capillary refill. Tenderness present. No swelling, deformity or bony tenderness. Normal pulse.  Skin:    General: Skin is warm and dry.     Capillary Refill: Capillary refill takes less than 2 seconds.  Neurological:     General: No focal deficit present.     Mental Status: He is alert and oriented to person, place, and time.  Psychiatric:        Mood and Affect: Mood normal.        Behavior: Behavior normal.     Procedures  No results found for this or any previous visit (from the past 24 hours).  Assessment and Plan :     Discharge Instructions       1. Plantar fasciitis, right (Primary) - diclofenac  (VOLTAREN ) 50 MG EC tablet; Take 1 tablet (50 mg total) by mouth 2 (two) times daily.  Dispense: 30 tablet; Refill: 1 - AMB referral to sports medicine for follow-up evaluation if symptoms do not improve with current therapy. - Find more supportive insoles for your work boots to support arch and decrease stress on  the plantar fascia - Usually memory foam inserts or gel inserts give proper support to the arches and prevents further injury. - Apply ice to the sole of the foot and massage gently 2-3 times a day for 10 to 15 minutes at a time to help decrease inflammation and pain.      Lanai Conlee B Calli Bashor   Bassel Gaskill, [place], [place] 01/17/24 1108

## 2024-01-17 NOTE — ED Triage Notes (Signed)
 Pt c/o right foot pain arch of foot for 1 day. Denies any injury. Pt unable to bear weight.

## 2024-01-17 NOTE — Discharge Instructions (Addendum)
  1. Plantar fasciitis, right (Primary) - diclofenac  (VOLTAREN ) 50 MG EC tablet; Take 1 tablet (50 mg total) by mouth 2 (two) times daily.  Dispense: 30 tablet; Refill: 1 - AMB referral to sports medicine for follow-up evaluation if symptoms do not improve with current therapy. - Find more supportive insoles for your work boots to support arch and decrease stress on the plantar fascia - Usually memory foam inserts or gel inserts give proper support to the arches and prevents further injury. - Apply ice to the sole of the foot and massage gently 2-3 times a day for 10 to 15 minutes at a time to help decrease inflammation and pain.
